# Patient Record
Sex: Female | Born: 1945 | Hispanic: Yes | Marital: Married | Smoking: Never smoker
Health system: Southern US, Community
[De-identification: ages and names within clinical notes are randomized; demographics above are authoritative.]

## PROBLEM LIST (undated history)

## (undated) DIAGNOSIS — K449 Diaphragmatic hernia without obstruction or gangrene: Secondary | ICD-10-CM

## (undated) DIAGNOSIS — K219 Gastro-esophageal reflux disease without esophagitis: Secondary | ICD-10-CM

## (undated) DIAGNOSIS — K648 Other hemorrhoids: Secondary | ICD-10-CM

## (undated) DIAGNOSIS — G309 Alzheimer's disease, unspecified: Secondary | ICD-10-CM

## (undated) DIAGNOSIS — F419 Anxiety disorder, unspecified: Secondary | ICD-10-CM

## (undated) DIAGNOSIS — F0280 Dementia in other diseases classified elsewhere without behavioral disturbance: Secondary | ICD-10-CM

## (undated) DIAGNOSIS — F329 Major depressive disorder, single episode, unspecified: Secondary | ICD-10-CM

## (undated) DIAGNOSIS — H9319 Tinnitus, unspecified ear: Secondary | ICD-10-CM

## (undated) DIAGNOSIS — F32A Depression, unspecified: Secondary | ICD-10-CM

## (undated) DIAGNOSIS — F0390 Unspecified dementia without behavioral disturbance: Secondary | ICD-10-CM

## (undated) DIAGNOSIS — K635 Polyp of colon: Secondary | ICD-10-CM

## (undated) HISTORY — PX: OTHER SURGICAL HISTORY: SHX169

## (undated) HISTORY — DX: Major depressive disorder, single episode, unspecified: F32.9

## (undated) HISTORY — DX: Diaphragmatic hernia without obstruction or gangrene: K44.9

## (undated) HISTORY — DX: Gastro-esophageal reflux disease without esophagitis: K21.9

## (undated) HISTORY — PX: CERVICAL SPINE SURGERY: SHX589

## (undated) HISTORY — DX: Polyp of colon: K63.5

## (undated) HISTORY — PX: WISDOM TOOTH EXTRACTION: SHX21

## (undated) HISTORY — DX: Tinnitus, unspecified ear: H93.19

## (undated) HISTORY — PX: ABDOMINAL HYSTERECTOMY: SHX81

## (undated) HISTORY — PX: APPENDECTOMY: SHX54

## (undated) HISTORY — DX: Dementia in other diseases classified elsewhere, unspecified severity, without behavioral disturbance, psychotic disturbance, mood disturbance, and anxiety: F02.80

## (undated) HISTORY — PX: TONSILLECTOMY: SUR1361

## (undated) HISTORY — DX: Other hemorrhoids: K64.8

## (undated) HISTORY — DX: Anxiety disorder, unspecified: F41.9

## (undated) HISTORY — DX: Alzheimer's disease, unspecified: G30.9

## (undated) HISTORY — DX: Unspecified dementia, unspecified severity, without behavioral disturbance, psychotic disturbance, mood disturbance, and anxiety: F03.90

## (undated) HISTORY — PX: MOUTH SURGERY: SHX715

## (undated) HISTORY — DX: Depression, unspecified: F32.A

---

## 1997-09-03 ENCOUNTER — Ambulatory Visit (HOSPITAL_COMMUNITY): Admission: RE | Admit: 1997-09-03 | Discharge: 1997-09-03 | Payer: Self-pay | Admitting: Family Medicine

## 1998-09-20 ENCOUNTER — Ambulatory Visit (HOSPITAL_COMMUNITY): Admission: RE | Admit: 1998-09-20 | Discharge: 1998-09-20 | Payer: Self-pay | Admitting: Family Medicine

## 1998-10-03 ENCOUNTER — Encounter: Payer: Self-pay | Admitting: Family Medicine

## 1998-10-03 ENCOUNTER — Ambulatory Visit (HOSPITAL_COMMUNITY): Admission: RE | Admit: 1998-10-03 | Discharge: 1998-10-03 | Payer: Self-pay | Admitting: Internal Medicine

## 1999-09-25 ENCOUNTER — Encounter: Payer: Self-pay | Admitting: Family Medicine

## 1999-09-25 ENCOUNTER — Ambulatory Visit (HOSPITAL_COMMUNITY): Admission: RE | Admit: 1999-09-25 | Discharge: 1999-09-25 | Payer: Self-pay | Admitting: Family Medicine

## 2000-09-28 ENCOUNTER — Ambulatory Visit (HOSPITAL_COMMUNITY): Admission: RE | Admit: 2000-09-28 | Discharge: 2000-09-28 | Payer: Self-pay | Admitting: Family Medicine

## 2000-09-28 ENCOUNTER — Encounter: Payer: Self-pay | Admitting: Family Medicine

## 2001-11-02 ENCOUNTER — Encounter: Payer: Self-pay | Admitting: Family Medicine

## 2001-11-02 ENCOUNTER — Ambulatory Visit (HOSPITAL_COMMUNITY): Admission: RE | Admit: 2001-11-02 | Discharge: 2001-11-02 | Payer: Self-pay | Admitting: Family Medicine

## 2002-11-06 ENCOUNTER — Encounter: Payer: Self-pay | Admitting: Family Medicine

## 2002-11-06 ENCOUNTER — Ambulatory Visit (HOSPITAL_COMMUNITY): Admission: RE | Admit: 2002-11-06 | Discharge: 2002-11-06 | Payer: Self-pay | Admitting: Family Medicine

## 2003-11-07 ENCOUNTER — Ambulatory Visit (HOSPITAL_COMMUNITY): Admission: RE | Admit: 2003-11-07 | Discharge: 2003-11-07 | Payer: Self-pay | Admitting: Family Medicine

## 2004-02-20 ENCOUNTER — Ambulatory Visit (HOSPITAL_COMMUNITY): Admission: RE | Admit: 2004-02-20 | Discharge: 2004-02-20 | Payer: Self-pay | Admitting: Gastroenterology

## 2004-02-20 ENCOUNTER — Encounter (INDEPENDENT_AMBULATORY_CARE_PROVIDER_SITE_OTHER): Payer: Self-pay | Admitting: *Deleted

## 2004-08-14 ENCOUNTER — Encounter: Admission: RE | Admit: 2004-08-14 | Discharge: 2004-08-14 | Payer: Self-pay | Admitting: Family Medicine

## 2004-11-28 ENCOUNTER — Encounter: Admission: RE | Admit: 2004-11-28 | Discharge: 2004-11-28 | Payer: Self-pay | Admitting: Family Medicine

## 2006-01-04 ENCOUNTER — Encounter: Admission: RE | Admit: 2006-01-04 | Discharge: 2006-01-04 | Payer: Self-pay | Admitting: Family Medicine

## 2006-01-26 ENCOUNTER — Encounter: Admission: RE | Admit: 2006-01-26 | Discharge: 2006-01-26 | Payer: Self-pay | Admitting: Family Medicine

## 2006-09-21 ENCOUNTER — Ambulatory Visit (HOSPITAL_COMMUNITY): Admission: RE | Admit: 2006-09-21 | Discharge: 2006-09-22 | Payer: Self-pay | Admitting: Neurosurgery

## 2007-02-18 ENCOUNTER — Encounter: Admission: RE | Admit: 2007-02-18 | Discharge: 2007-02-18 | Payer: Self-pay | Admitting: Family Medicine

## 2008-02-22 ENCOUNTER — Encounter: Admission: RE | Admit: 2008-02-22 | Discharge: 2008-02-22 | Payer: Self-pay | Admitting: Family Medicine

## 2009-06-19 ENCOUNTER — Encounter: Admission: RE | Admit: 2009-06-19 | Discharge: 2009-07-23 | Payer: Self-pay | Admitting: Family Medicine

## 2009-07-18 ENCOUNTER — Encounter: Admission: RE | Admit: 2009-07-18 | Discharge: 2009-07-18 | Payer: Self-pay | Admitting: Family Medicine

## 2009-07-19 ENCOUNTER — Encounter: Admission: RE | Admit: 2009-07-19 | Discharge: 2009-07-19 | Payer: Self-pay | Admitting: Family Medicine

## 2010-06-25 ENCOUNTER — Other Ambulatory Visit: Payer: Self-pay | Admitting: Dermatology

## 2010-07-11 ENCOUNTER — Other Ambulatory Visit: Payer: Self-pay | Admitting: Family Medicine

## 2010-07-11 DIAGNOSIS — Z1231 Encounter for screening mammogram for malignant neoplasm of breast: Secondary | ICD-10-CM

## 2010-07-31 ENCOUNTER — Ambulatory Visit: Payer: Self-pay

## 2010-07-31 ENCOUNTER — Ambulatory Visit
Admission: RE | Admit: 2010-07-31 | Discharge: 2010-07-31 | Disposition: A | Discharge status: Home / Self Care | Payer: BC Managed Care – PPO | Source: Ambulatory Visit | Attending: Family Medicine | Admitting: Family Medicine

## 2010-07-31 DIAGNOSIS — Z1231 Encounter for screening mammogram for malignant neoplasm of breast: Secondary | ICD-10-CM

## 2010-08-05 NOTE — Op Note (Signed)
[name], [name]                ACCOUNT NO.:  [id number]   MEDICAL RECORD NO.:  [id number]          PATIENT TYPE:  AMB   LOCATION:  SDS                          FACILITY:  [facility]   PHYSICIAN:  Danae Orleans. Venetia Maxon, M.D.  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   POSTOPERATIVE DIAGNOSIS:  Herniated cervical disk with spondylosis,  degenerative disease, stenosis and radiculopathy C5-6 and C6-7 levels.   PROCEDURE:  Anterior cervical decompression and fusion C5-6 and C6-7  levels with PEEK interbody cages, morcellized bone autograft and ostia  cell and anterior cervical plate.   SURGEON:  Dr. Venetia Maxon   ASSISTANT:  Poteat RN and Phoebe Perch, M.D.   ANESTHESIA:  General endotracheal anesthesia.   BLOOD LOSS:  Minimal.   COMPLICATIONS:  None.   DISPOSITION:  Recovery.   INDICATIONS:  [name] is a 65 year old woman with neck and left greater  than right upper extremity pain and weakness.  She has a significant  disk herniation at C6-7 on the left and also significant foraminal  stenosis C5-6 on the left.  It elected for her to undergo two level  anterior cervical decompression and fusion.   PROCEDURE:  [name] is brought to the operating room.  Following  satisfactory uncomplicated induction of general endotracheal anesthesia  and placed intravenous lines, the patient was placed in supine position  on the operating table.  Her neck was placed in slight extension.  She  was placed in 5 pounds halter traction.  Her anterior neck was then  prepped and draped in the usual sterile fashion. After infiltrating the  skin and subcutaneous tissues and in line with the left C6-5 carotid  tubercle on the left side of midline, from midline to the anterior  border  sternocleidomastoid muscle with local lidocaine.  Incision was  made carried sharply through platysma layer.  Subplatysmal dissection  was performed exposing the anterior border sternocleidomastoid muscle  and using blunt dissection the carotid sheath kept lateral, trachea and  esophagus kept medial exposing the anterior cervical spine.  A bent  spinal needle was placed at what was felt to be the C5-6 level and this  was confirmed on intraoperative x-ray. Using electrocautery and Key  elevator.  The longus colli muscles were taken down from the anterior  cervical spine from C5-C7 and self-retaining shadow line retractor was  placed facilitate exposure. Ventral osteophytes at C5-6 and C6-7 were  removed with Leksell rongeur and the bone was saved for later use in  bone grafting.  The interspaces at each of these levels were fairly  degenerated and disk material was removed in piecemeal fashion after  incising the interspace at C5-6, C6-7 level.  Subsequently the endplates  stripped of residual disk material with a variety of Carlen curettes.  High-speed drill was then utilized to decorticate the endplates at C6-7,  C5-6.  Distraction pins were used to facilitate the  disk space  distraction initially at C6-7. The endplates were decorticated and  uncinate spurs drilled down and subsequently, herniated disk material  was removed. The posterior longitudinal ligament was incised and removed  with 2 and 3 mm millimeter gold tip Kerrison rongeurs. Spinal cord dura  was decompressed as were both C7 nerve roots and the neural foramina.  Hemostasis was assured after trial sizing 6-mm PEEK interbody cage was  selected, packed with morcellized bone autograft and ostia cell,  inserted the interspace and countersunk appropriately.  Similar  decompression was performed at the C5-6 level where the large uncinate  spurs were drilled down and the endplates were decorticated. The  posterior longitudinal  ligament and osteophytes were removed resulting  in significant decompression of the thecal sac and both C6 nerve roots.  Hemostasis again assured and after a trial sizing a 55-mm medium PEEK  interbody cage was selected, packed morselized bone autograft ostia cell  inserted interspace and countersunk appropriately.  A 30 mm Trestle  anterior cervical plate was then affixed to the anterior cervical spine  using variable angle 14 mm screws two at C4, two at C6, and two at C7.  All screws had excellent purchase.  Locking mechanisms were engaged.  Traction weight was removed prior to placing the plate.  Final x-ray  demonstrated well-positioned interbody graft anterior cervical plate.  Wound was irrigated bacitracin saline.  Soft tissues were inspected and  found to be in good repair.  Platysma layer was closed with 3-0 Vicryl  sutures.  Skin edges were approximated 3-0 Vicryl interrupted inverted  sutures.  The wound was dressed with Dermabond.  The patient extubated  in the operating room taken to recovery in stable satisfactory having  tolerated operation well.  Counts correct at end of the case.      Danae Orleans. Venetia Maxon, M.D.  Electronically Signed     JDS/MEDQ  D:  09/21/2006  T:  09/21/2006  Job:  [id number]

## 2010-08-08 NOTE — Op Note (Signed)
[name], [name]                ACCOUNT NO.:  [id number]   MEDICAL RECORD NO.:  [id number]          PATIENT TYPE:  AMB   LOCATION:  ENDO                         [facility]ACILITY:  [facility]   PHYSICIAN:  John C. Madilyn Fireman, M.D.    DATE OF BIRTH:  Jul 08, 1945   DATE OF PROCEDURE:  02/20/2004  DATE OF DISCHARGE:                                 OPERATIVE REPORT   PROCEDURE:  Colonoscopy.   SURGEON:   INDICATIONS FOR PROCEDURE:  Average risk colon cancer screening in a 65-year-  old patient.   DESCRIPTION OF PROCEDURE:  The patient was placed in the left lateral  decubitus position and placed on the pulse monitor with continuous low flow  oxygen delivered by nasal cannula.  She was sedated with 50 mcg IV Fentanyl  and 6 mg IV Versed.  Olympus video colonoscope was inserted into the rectum  and advanced to the cecum, confirmed by transillumination of McBurney's  point, visualization of ileocecal valve and appendiceal orifice.  The prep  was excellent.  The cecum, ascending, transverse and descending colon all  appeared normal with no masses, polyps, diverticula or other mucosal  abnormalities.  Within the sigmoid colon there was a 6 mm polyp at 25 cm  which was fulgurated by hot biopsy.  The remainder of the sigmoid and rectum  appeared normal.  The scope was then withdrawn and the patient returned to  the recovery room in stable condition.  She tolerated the procedure well and  there were no immediate complications.   IMPRESSION:  Normal sigmoid colon polyp, otherwise normal study.   PLAN:  Await histology to determine method and interval for future colon  screening.      JCH/MEDQ  D:  02/20/2004  T:  02/20/2004  Job:  [id number]   cc:   Otilio Connors. Gerri Spore, M.D.  [address]  [place]  [place] [address]  Fax: [phone]

## 2011-01-07 LAB — COMPREHENSIVE METABOLIC PANEL
ALT: 23
Alkaline Phosphatase: 45
BUN: 13

## 2011-01-07 LAB — DIFFERENTIAL
Basophils Relative: 1
Eosinophils Relative: 1
Lymphocytes Relative: 50 — ABNORMAL HIGH
Monocytes Absolute: 0.4
Monocytes Relative: 8
Neutro Abs: 2
Neutrophils Relative %: 40 — ABNORMAL LOW

## 2011-01-07 LAB — PROTIME-INR
INR: 0.9
Prothrombin Time: 12.4

## 2011-01-07 LAB — CBC
HCT: 39
Hemoglobin: 13
MCHC: 33.4
MCV: 84
Platelets: 349
WBC: 5

## 2011-01-07 LAB — URINALYSIS, ROUTINE W REFLEX MICROSCOPIC
Ketones, ur: NEGATIVE
Protein, ur: NEGATIVE
Urobilinogen, UA: 0.2

## 2011-01-07 LAB — TYPE AND SCREEN: ABO/RH(D): O POS

## 2011-08-21 ENCOUNTER — Other Ambulatory Visit: Payer: Self-pay | Admitting: Family Medicine

## 2011-08-21 DIAGNOSIS — Z1231 Encounter for screening mammogram for malignant neoplasm of breast: Secondary | ICD-10-CM

## 2011-09-11 ENCOUNTER — Ambulatory Visit
Admission: RE | Admit: 2011-09-11 | Discharge: 2011-09-11 | Disposition: A | Discharge status: Home / Self Care | Payer: BC Managed Care – PPO | Source: Ambulatory Visit | Attending: Family Medicine | Admitting: Family Medicine

## 2011-09-11 DIAGNOSIS — Z1231 Encounter for screening mammogram for malignant neoplasm of breast: Secondary | ICD-10-CM

## 2011-10-05 ENCOUNTER — Ambulatory Visit: Payer: BC Managed Care – PPO

## 2012-10-31 ENCOUNTER — Other Ambulatory Visit: Payer: Self-pay

## 2012-10-31 DIAGNOSIS — Z1231 Encounter for screening mammogram for malignant neoplasm of breast: Secondary | ICD-10-CM

## 2012-11-16 ENCOUNTER — Ambulatory Visit
Admission: RE | Admit: 2012-11-16 | Discharge: 2012-11-16 | Disposition: A | Discharge status: Home / Self Care | Payer: Medicare Other | Source: Ambulatory Visit

## 2012-11-16 ENCOUNTER — Ambulatory Visit: Payer: BC Managed Care – PPO

## 2012-11-16 DIAGNOSIS — Z1231 Encounter for screening mammogram for malignant neoplasm of breast: Secondary | ICD-10-CM

## 2013-10-24 ENCOUNTER — Other Ambulatory Visit: Payer: Self-pay

## 2013-10-24 DIAGNOSIS — Z1231 Encounter for screening mammogram for malignant neoplasm of breast: Secondary | ICD-10-CM

## 2013-11-09 ENCOUNTER — Encounter: Payer: Self-pay | Admitting: Gastroenterology

## 2013-11-21 ENCOUNTER — Ambulatory Visit: Payer: Medicare Other

## 2013-11-28 ENCOUNTER — Ambulatory Visit: Payer: Medicare Other

## 2013-11-29 ENCOUNTER — Ambulatory Visit
Admission: RE | Admit: 2013-11-29 | Discharge: 2013-11-29 | Disposition: A | Discharge status: Home / Self Care | Payer: Medicare HMO | Source: Ambulatory Visit

## 2013-11-29 DIAGNOSIS — Z1231 Encounter for screening mammogram for malignant neoplasm of breast: Secondary | ICD-10-CM

## 2014-01-05 ENCOUNTER — Encounter: Payer: Self-pay | Admitting: Neurology

## 2014-01-05 ENCOUNTER — Ambulatory Visit (INDEPENDENT_AMBULATORY_CARE_PROVIDER_SITE_OTHER): Payer: Medicare HMO | Admitting: Neurology

## 2014-01-05 ENCOUNTER — Encounter (INDEPENDENT_AMBULATORY_CARE_PROVIDER_SITE_OTHER): Payer: Self-pay

## 2014-01-05 VITALS — BP 132/73 | HR 67 | Temp 97.7°F | Resp 14 | Ht 62.0 in | Wt 148.0 lb

## 2014-01-05 DIAGNOSIS — G3184 Mild cognitive impairment, so stated: Secondary | ICD-10-CM

## 2014-01-05 LAB — COMPREHENSIVE METABOLIC PANEL
ALT: 24 IU/L (ref 0–32)
AST: 22 IU/L (ref 0–40)
Albumin/Globulin Ratio: 1.8 (ref 1.1–2.5)
Albumin: 4.7 g/dL (ref 3.6–4.8)
Alkaline Phosphatase: 74 IU/L (ref 39–117)
BUN/Creatinine Ratio: 18 (ref 11–26)
BUN: 14 mg/dL (ref 8–27)
CALCIUM: 10.3 mg/dL (ref 8.7–10.3)
CO2: 28 mmol/L (ref 18–29)
CREATININE: 0.79 mg/dL (ref 0.57–1.00)
Chloride: 100 mmol/L (ref 96–108)
GFR calc Af Amer: 90 mL/min/{1.73_m2} (ref >59)
GFR calc non Af Amer: 78 mL/min/{1.73_m2} (ref >59)
GLUCOSE: 95 mg/dL (ref 65–99)
Globulin, Total: 2.6 g/dL (ref 1.5–4.5)
Potassium: 5.1 mmol/L (ref 3.5–5.2)
SODIUM: 137 mmol/L (ref 134–144)
Total Bilirubin: 0.2 mg/dL (ref 0.0–1.2)
Total Protein: 7.3 g/dL (ref 6.0–8.5)

## 2014-01-05 LAB — CBC WITH DIFFERENTIAL/PLATELET
BASOS: 1 %
Basophils Absolute: 0 10*3/uL (ref 0.0–0.2)
EOS: 1 %
Eosinophils Absolute: 0 10*3/uL (ref 0.0–0.4)
HEMATOCRIT: 40.3 % (ref 34.0–46.6)
Hemoglobin: 13.8 g/dL (ref 11.1–15.9)
LYMPHS: 49 %
Lymphocytes Absolute: 2.2 10*3/uL (ref 0.7–3.1)
MCH: 27.6 pg (ref 26.6–33.0)
MCHC: 34.2 g/dL (ref 31.5–35.7)
MCV: 81 fL (ref 79–97)
Monocytes Absolute: 0.4 10*3/uL (ref 0.1–0.9)
Monocytes: 8 %
Neutrophils Absolute: 1.8 10*3/uL (ref 1.4–7.0)
Neutrophils Relative %: 41 %
RBC: 5 x10E6/uL (ref 3.77–5.28)
RDW: 14.5 % (ref 12.3–15.4)
WBC: 4.4 10*3/uL (ref 3.4–10.8)

## 2014-01-05 NOTE — Progress Notes (Signed)
[facility]   Provider:  Larey Seat, M D  Referring Provider: Saralyn Pilar, MD Primary Care Physician:  Angelina Pih, MD   Chief Complaint  Patient presents with  . New Evaluation    rm 11    HPI:  [name] is a 68 y.o. female , who Is seen here as a referral from Dr. Prince Rome for subjective memory loss, hypersomnia.     [name] herself introduced her symptoms as anxiety depression and theophyllines in memory. She also had some weight loss associated and to she said that she is under a lot of stress.  She feels fatigued,  she has tinnitus,  changes in appetite disinterest in some activities sometimes, is confused but certainly feels forgetful, and she reports she has restless legs.  The patient reports that she is certainly forgetful at times little confused. She has a normal by the same object 3 times in the supermarkets she has to keep lists at certain places to not forget sequences shopping etc. Sometimes she forgets with a left has been placed. Her husband has taken over all the financial paperwork, and she reports that he also keeps no doctor's appointments etc. on her calendar. He rolled over the last year. Her antidepressants were changed by her primary care physician after her old one retired, she had increased the Zoloft from 46 to 100 mg , but felt her memory was not improved. Her husband tries to help her organize desks and kitchen cabinets , but she feels now more than ever that she cannot find what she looks for. This upsets her greatly.    She has regular bed times, at about 11 PM , reading in bed, falling asleep within 30 minutes or less. Her husband noted her legs kicking and twitching. She has no restless legs, however.  The patient is not aware of her movements, she acts out dreams now, yelling in her sleep , thrashing.  She denies visuial or auditory hallucinations.   Her surgical history she endorsed a left knee surgery hysterectomy an  anterior cervical spine fusion a tonsillectomy and appendectomy.  The patient lives with her husband  ( from [place] ) her children have gone on to their own homes,  she drinks about 3 caffeinated beverages a day . Her mother is alive at 57 ( [place] her father lived to a ripe age of 67.  She's currently on Ativan  ( started last week ) as needed to initiate sleep but also to help with anxiety attacks and she takes Zoloft 100 mg a day.  The patient is appearing happy and conversant, but concerned. She reported crying spells before antidepressants were introduced 2001, she reported she had a nervous break down on 12-02-99.    Review of Systems: Out of a complete 14 system review, the patient complains of only the following symptoms, and all other reviewed systems are negative.  I cannot focus- I go to the kitchen and forgot why. She endorsed the depression score at 5 points , by GDS 8 Mini-Mental Status Examination revealed 27/30 points. A MOCA will follow later.     History   Social History  . Marital Status: Married    Spouse Name: [name]    Number of Children: 2  . Years of Education: hs   Occupational History  . Retired    Social History Main Topics  . Smoking status: Never Smoker   . Smokeless tobacco: Not on file  . Alcohol  Use: No  . Drug Use: No  . Sexual Activity: Not on file   Other Topics Concern  . Not on file   Social History Narrative  . No narrative on file    History reviewed. No pertinent family history.  Past Medical History  Diagnosis Date  . Depression   . Anxiety     Past Surgical History  Procedure Laterality Date  . Abdominal hysterectomy    . Appendectomy    . Tonsillectomy    . Cervical spine surgery    . Knee surgery Right   . Mouth surgery      roof of mouth    Current Outpatient Prescriptions  Medication Sig Dispense Refill  . FLUoxetine (PROZAC) 40 MG capsule Take 40 mg by mouth daily.      Marland Kitchen gabapentin (NEURONTIN)  300 MG capsule Take 300 mg by mouth daily.      Marland Kitchen LORazepam (ATIVAN) 0.5 MG tablet Take 0.5 mg by mouth as needed.      . sertraline (ZOLOFT) 100 MG tablet Take 100 mg by mouth daily.       No current facility-administered medications for this visit.    Allergies as of 01/05/2014 - Review Complete 01/05/2014  Allergen Reaction Noted  . Ibuprofen Nausea And Vomiting 01/05/2014    Vitals: BP 132/73  Pulse 67  Temp(Src) 97.7 F (36.5 C) (Oral)  Resp 14  Ht 5\' 2"  (1.575 m)  Wt 148 lb (67.132 kg)  BMI 27.06 kg/m2 Last Weight:  Wt Readings from Last 1 Encounters:  01/05/14 148 lb (67.132 kg)       Last Height:   Ht Readings from Last 1 Encounters:  01/05/14 5\' 2"  (1.575 m)    Physical exam:  General: The patient is awake, alert and appears not in acute distress. The patient is well groomed. Head: Normocephalic, atraumatic. Neck is supple. Mallampati 2 She repo,  neck circumference: 13.5 inches . Nasal airflow unrestricted  TMJ is  Not evident . Retrognathia is not seen.  Cardiovascular:  Regular rate and rhythm , without  murmurs or carotid bruit, and without distended neck veins. Respiratory: Lungs are clear to auscultation. Skin:  Without evidence of edema, or rash Trunk: BMI is not  elevated and patient  has normal posture.  Neurologic exam : The patient is awake and alert, oriented to place and time.  She has a  fear of people talking about her.  Memory subjective  described as impaired, inattentive, unfocussed.  There is a normal attention span & concentration ability. Speech is fluent with aphasia. In her interview she was looking for words.   Mood and affect are giddy , Cranial nerves: Pupils are equal and briskly reactive to light. Funduscopic exam without evidence of pallor or edema.  Extraocular movements  in vertical and horizontal planes intact and without nystagmus. Visual fields by finger perimetry are intact. Hearing to finger rub intact.  Facial sensation  intact to fine touch. Facial motor strength is symmetric and tongue and uvula move midline.  Motor exam: Normal tone, muscle bulk and symmetric, strength in all extremities.  Sensory:  Fine touch, pinprick and vibration were tested in all extremities.  Proprioception is normal.  Coordination: Rapid alternating movements in the fingers/hands is normal.  Finger-to-nose maneuver normal without evidence of ataxia, dysmetria or tremor.  Gait and station: Patient walks without assistive device and is able unassisted to climb up to the exam table. Strength within normal limits. Stance is stable and normal.  Tandem gait is unfragmented.  Romberg testing is negative.  Deep tendon reflexes: in the  upper and lower extremities are symmetric and intact. Babinski maneuver response is  downgoing.   Assessment:  After physical and neurologic examination, review of laboratory studies, imaging, neurophysiology testing and pre-existing records, assessment is   1) Mild cognitive impairment, but this can be caused by medication - benzodiazepines and sleep aids can affect her memory next morning.  2) SSRI Zoloft , at 100 mg may be too high a dose for her. Anxiety and fatigue have improved after this medication was prescribed.  3) MOCA was added ,  Patient was not able to complete the trail making test, broke off at the last 2 steps. Clock drawing was impaired, numbers were misplaced. Remembered only one of 5 words, has trouble with serial 7th, 22-30 MOCA.     The patient was advised of the nature of the diagnosed sleep disorder , the treatment options and risks for general a health and wellness arising from not treating the condition. Visit duration was 45 minutes.   Plan:  Treatment plan and additional workup :  I would like to obtain an MRI brain ,  CMET, CBC and diff.  EEG Keep a sleep diary.  Rv in 4-6 weeks .        Asencion Partridge Addley Ballinger MD  01/05/2014

## 2014-01-08 ENCOUNTER — Ambulatory Visit (INDEPENDENT_AMBULATORY_CARE_PROVIDER_SITE_OTHER): Payer: Medicare HMO | Admitting: Radiology

## 2014-01-08 DIAGNOSIS — G3184 Mild cognitive impairment, so stated: Secondary | ICD-10-CM

## 2014-01-09 DIAGNOSIS — G3184 Mild cognitive impairment, so stated: Secondary | ICD-10-CM | POA: Insufficient documentation

## 2014-01-16 ENCOUNTER — Encounter: Payer: Self-pay | Admitting: Gastroenterology

## 2014-01-16 ENCOUNTER — Ambulatory Visit (INDEPENDENT_AMBULATORY_CARE_PROVIDER_SITE_OTHER): Payer: Medicare HMO | Admitting: Gastroenterology

## 2014-01-16 VITALS — BP 124/68 | HR 74 | Ht 62.0 in | Wt 148.0 lb

## 2014-01-16 DIAGNOSIS — R195 Other fecal abnormalities: Secondary | ICD-10-CM

## 2014-01-16 DIAGNOSIS — F32A Depression, unspecified: Secondary | ICD-10-CM

## 2014-01-16 DIAGNOSIS — R197 Diarrhea, unspecified: Secondary | ICD-10-CM | POA: Insufficient documentation

## 2014-01-16 DIAGNOSIS — F329 Major depressive disorder, single episode, unspecified: Secondary | ICD-10-CM | POA: Insufficient documentation

## 2014-01-16 NOTE — Assessment & Plan Note (Signed)
Recent Hemoccult-positive stool.  CBC is normal.  Occult bleeding do to neoplasm or polyp, in view of colonoscopy in 2014, is unlikely..  Recommendations #1 follow-up Hemoccults

## 2014-01-16 NOTE — Assessment & Plan Note (Signed)
Several week history of mild change of bowel habits consisting of poorly formed and loose stools.  I suspect that symptoms are related to medications, specifically antidepressants.  Recent colonoscopy in 2014 mitigates any structural left O'Malley's of the colon.  Recommendations #1 since patient's improvement from anxiety and depression may be due to Zoloft, will try to maintain this medication while adding an antidiarrheal medication.  She'll be started on Imodium 1-2 tabs daily

## 2014-01-16 NOTE — Progress Notes (Signed)
    _                                                                                                                History of Present Illness:  [name]  is a pleasant 68 year old female referred for evaluation of diarrhea.  For the past several weeks she is having problems with loose stools.  She has a loose stool every 1-2 days accompanied by some urgency.  She denies abdominal pain or rectal bleeding.  She did test Hemoccult positive August, 2015.  She apparently has been on various anti-depressants.  She most recently was placed on Zoloft and reports improvement in her anxiety and depression.  Last colonoscopy in 2014 was entirely normal.  Prior colonoscopy in 2005 demonstrated a hyperplastic polyp.   Past Medical History  Diagnosis Date  . Depression   . Anxiety   . Tinnitus    Past Surgical History  Procedure Laterality Date  . Abdominal hysterectomy    . Appendectomy    . Tonsillectomy    . Cervical spine surgery    . Knee surgery Right   . Mouth surgery      roof of mouth   family history is negative for Colon cancer. Current Outpatient Prescriptions  Medication Sig Dispense Refill  . LORazepam (ATIVAN) 0.5 MG tablet Take 0.5 mg by mouth as needed.      . sertraline (ZOLOFT) 100 MG tablet Take 100 mg by mouth daily.       No current facility-administered medications for this visit.   Allergies as of 01/16/2014 - Review Complete 01/16/2014  Allergen Reaction Noted  . Ibuprofen Nausea And Vomiting 01/05/2014    reports that she has never smoked. She has never used smokeless tobacco. She reports that she does not drink alcohol or use illicit drugs.   Review of Systems: Pertinent positive and negative review of systems were noted in the above HPI section. All other review of systems were otherwise negative.  Vital signs were reviewed in today's medical record Physical Exam: General: Well developed , well nourished, no acute distress Skin:  anicteric Head: Normocephalic and atraumatic Eyes:  sclerae anicteric, EOMI Ears: Normal auditory acuity Mouth: No deformity or lesions Neck: Supple, no masses or thyromegaly Lungs: Clear throughout to auscultation Heart: Regular rate and rhythm; no murmurs, rubs or bruits Abdomen: Soft, non tender and non distended. No masses, hepatosplenomegaly or hernias noted. Normal Bowel sounds Rectal:deferred Musculoskeletal: Symmetrical with no gross deformities  Skin: No lesions on visible extremities Pulses:  Normal pulses noted Extremities: No clubbing, cyanosis, edema or deformities noted Neurological: Alert oriented x 4, grossly nonfocal Cervical Nodes:  No significant cervical adenopathy Inguinal Nodes: No significant inguinal adenopathy Psychological:  Alert and cooperative. Normal mood and affect  See Assessment and Plan under Problem List

## 2014-01-16 NOTE — Patient Instructions (Signed)
Take 1-2 Imodium every morning Follow up in 6-8 weeks Call back to schedule we have no available appointments at this time

## 2014-01-17 ENCOUNTER — Telehealth: Payer: Self-pay | Admitting: Neurology

## 2014-01-17 NOTE — Telephone Encounter (Signed)
Patient's husband calling to check if Dr. Brett Fairy still wants patient to have MRI since her EEG came back fine, please return call and advise.

## 2014-01-17 NOTE — Procedures (Signed)
[facility]  EEG (ELECTROENCEPHALOGRAM) REPORT   STUDY DATE:  01-08-14  PATIENT NAME: [name], [name]   [birth year]  MRN:    ORDERING CLINICIAN: Larey Seat, MD   TECHNOLOGIST:  Ronnald Ramp  TECHNIQUE: Electroencephalogram was recorded utilizing standard 10-20 system of lead placement and reformatted into average and bipolar montages.      RECORDING TIME: 30 minutes. ACTIVATION:  strobe lights and hyperventilation    CLINICAL INFORMATION:   Subjective memory loss and hypersomnia, Dr. Prince Rome referred.     FINDINGS: The EEG Background rhythm was difficult to determine, most likely at 10 hertz . The study shows a lot of artefact and movement.  There appears to be beta fast rhythm in excess in the anterior EEG channels.  The  EKG heart rates varied from 66 to 69 hertz in NSR.   Photic stimulation caused some entrainment at 11 and 13 hertz , but excessive eye blink artefact interfered  . I was not able to detect epileptiform discharges, periodic changes. Patient recorded while awake and drowsy but not asleep . Photic stimulation/ Hyperventilation were performed without clinical sins of loss of awareness, staring spells or rhythmic movements.   IMPRESSION:  EEG is of limited validity.  There were no epileptiform activities noted but the background rhythm was difficult to detect.  In memory loss disorders, a slowing of the generalized brain activity is expected. This was not found.     Larey Seat , MD

## 2014-01-18 NOTE — Progress Notes (Signed)
Quick Note:  Results given to pt and husband this am by Dr. Brett Fairy. I relayed again this pm. Husband verbalized understanding. ______

## 2014-01-18 NOTE — Telephone Encounter (Signed)
Spoke to spouse and explained the MRI is still necessary. CD

## 2014-01-22 ENCOUNTER — Ambulatory Visit
Admission: RE | Admit: 2014-01-22 | Discharge: 2014-01-22 | Disposition: A | Discharge status: Home / Self Care | Payer: Medicare HMO | Source: Ambulatory Visit | Attending: Neurology | Admitting: Neurology

## 2014-01-22 DIAGNOSIS — G3184 Mild cognitive impairment, so stated: Secondary | ICD-10-CM

## 2014-01-22 MED ORDER — GADOBENATE DIMEGLUMINE 529 MG/ML IV SOLN
14.0000 mL | Freq: Once | INTRAVENOUS | Status: AC | PRN
  Administered 2014-01-22: 14 mL via INTRAVENOUS

## 2014-01-24 ENCOUNTER — Encounter: Payer: Self-pay | Admitting: Gastroenterology

## 2014-01-30 NOTE — Progress Notes (Signed)
Quick Note:  I gave the results of MRI brain to her husband who would relay to pt. They have appt 02-06-14 at 1330 (4 wk f/u). (normal MRI). He verbalized understanding. ______

## 2014-02-06 ENCOUNTER — Ambulatory Visit (INDEPENDENT_AMBULATORY_CARE_PROVIDER_SITE_OTHER): Payer: Medicare HMO | Admitting: Neurology

## 2014-02-06 ENCOUNTER — Encounter: Payer: Self-pay | Admitting: Neurology

## 2014-02-06 VITALS — BP 123/69 | HR 70 | Temp 98.8°F | Resp 14 | Ht 62.5 in | Wt 149.0 lb

## 2014-02-06 DIAGNOSIS — G3184 Mild cognitive impairment, so stated: Secondary | ICD-10-CM

## 2014-02-06 MED ORDER — METHYLPHENIDATE HCL 10 MG PO TABS: 10.0000 mg | ORAL_TABLET | Freq: Once | ORAL | Status: DC

## 2014-02-06 MED ORDER — SERTRALINE HCL 50 MG PO TABS: ORAL_TABLET | ORAL | Status: DC

## 2014-02-06 NOTE — Patient Instructions (Signed)
I would like for you to see Dr. Casimiro Needle for an evaluation of attention deficit versus amnestic cognitive deficits.

## 2014-02-06 NOTE — Progress Notes (Signed)
[facility]   Provider:  Larey [name], M D  Referring Provider: No ref. provider found Primary Care Physician:  Amy Pih, MD   Chief Complaint  Patient presents with  . RV memory    Rm 42, husband    HPI:  [name] is a 68 y.o. female , who Is seen here as a referral from [name], now [name]  for subjective memory loss, hypersomnia.       [name] herself introduced her symptoms as anxiety,  depression and decline in memory.  She also had some weight loss associated and  she said that she is under a lot of stress.  She feels fatigued, she has tinnitus, changes in appetite disinterest in some activities sometimes, is confused but certainly feels forgetful, and she reports she has restless legs.  The patient reports that she is certainly forgetful at times little confused. She has a normal by the same object 3 times in the supermarkets she has to keep lists at certain places to not forget sequences shopping etc. Sometimes she forgets with a left has been placed. Her husband has taken over all the financial paperwork, and she reports that he also keeps no doctor's appointments etc. on her calendar. He rolled over the last year. Her antidepressants were changed by her primary care physician after her old one retired, she had increased the Zoloft from 40 to 100 mg , but felt her memory was not improved. Her husband tries to help her organize desks and kitchen cabinets , but she feels now more than ever that she cannot find what she looks for. This upsets her greatly.    She has regular bed times, at about 11 PM , reading in bed, falling asleep within 30 minutes or less. Her husband noted her legs kicking and twitching. She has no restless legs, however.  The patient is not aware of her movements, she acts out dreams now, yelling in her sleep , thrashing.  She denies visuial or auditory hallucinations.  Her surgical history she endorsed a left knee  surgery hysterectomy an anterior cervical spine fusion a tonsillectomy and appendectomy.  The patient lives with her husband (from [place] ) her children have gone on to their own homes,  she drinks about 3 caffeinated beverages a day . Her mother is alive at 36 ( [place] her father lived to a ripe age of 67.  She's currently on Ativan  ( started last week ) as needed to initiate sleep but also to help with anxiety attacks and she takes Zoloft 100 mg a day.  The patient is appearing happy and conversant, but concerned. She reported crying spells before antidepressants were introduced 2001, she reported she had a nervous break down on 12-02-99.   Interval history : Patient brought her husband to today's appointment .  She had a normal EEG, and brain MRI .  We repeated her MOCA today, she scored  23 out of 30 points, lost 3 of the 5 recall words.  She still interchanges right and left, and her husband believes she has dyslexia. She displaces things around the house, including a Diamond ring in her house in the [place]  He described his wife as easily distracted and discouraged, and highly anxious.  I used the term mild cognitive impairment but would consider a trial of Aderall or Ritalin in this patient .  Would she be willing to see Dr. Casimiro [name]. I would like for her  to reduce the Zoloft to 50 mg daily. She is less sleepy on lesser Zoloft, sleeps 8 hours a day.       Review of Systems: Out of a complete 14 system review, the patient complains of only the following symptoms, and all other reviewed systems are negative.  I cannot focus- I go to the kitchen and forgot why.  She endorsed the depression score at 5 points , by GDS 8 Mini-Mental Status Examination revealed 27/30 points. A MOCA  23-30 points.      History   Social History  . Marital Status: Married    Spouse Name: N/A    Number of Children: 2  . Years of Education: hs   Occupational History  .  Retired    Social History Main Topics  . Smoking status: Never Smoker   . Smokeless tobacco: Never Used  . Alcohol Use: No     Comment: occ (glass of wine)  . Drug Use: No  . Sexual Activity: Not on file   Other Topics Concern  . Not on file   Social History Narrative   Right handed.   Caffeine 3 cups daily.  Married, 2 kids.  College.  Retired.      Family History  Problem Relation Age of Onset  . Colon cancer Neg Hx     Past Medical History  Diagnosis Date  . Depression   . Anxiety   . Tinnitus     Past Surgical History  Procedure Laterality Date  . Abdominal hysterectomy    . Appendectomy    . Tonsillectomy    . Cervical spine surgery    . Knee surgery Right   . Mouth surgery      roof of mouth    Current Outpatient Prescriptions  Medication Sig Dispense Refill  . sertraline (ZOLOFT) 100 MG tablet Take 100 mg by mouth daily.    Marland Kitchen LORazepam (ATIVAN) 0.5 MG tablet Take 0.5 mg by mouth as needed.     No current facility-administered medications for this visit.    Allergies as of 02/06/2014 - Review Complete 02/06/2014  Allergen Reaction Noted  . Ibuprofen Nausea And Vomiting 01/05/2014    Vitals: BP 123/69 mmHg  Pulse 70  Temp(Src) 98.8 F (37.1 C) (Oral)  Resp 14  Ht 5' 2.5" (1.588 m)  Wt 149 lb (67.586 kg)  BMI 26.80 kg/m2 Last Weight:  Wt Readings from Last 1 Encounters:  02/06/14 149 lb (67.586 kg)       Last Height:   Ht Readings from Last 1 Encounters:  02/06/14 5' 2.5" (1.588 m)    Physical exam:  General: The patient is awake, alert and appears not in acute distress. The patient is well groomed. Head: Normocephalic, atraumatic. Neck is supple. Mallampati 2 She repo,  neck circumference: 13.5 inches . Nasal airflow unrestricted  TMJ is  Not evident . Retrognathia is not seen.  Cardiovascular:  Regular rate and rhythm , without  murmurs or carotid bruit, and without distended neck veins. Respiratory: Lungs are clear to  auscultation. Skin:  Without evidence of edema, or rash Trunk: BMI is not  elevated and patient  has normal posture.  Neurologic exam : The patient is awake and alert, oriented to place and time.  She has a  fear of people talking about her.  Memory subjective  described as impaired, inattentive, unfocussed.  There is a normal attention span & concentration ability. Speech is fluent with aphasia. In her interview she was  looking for words.   Mood and affect are giddy , Cranial nerves: Pupils are equal and briskly reactive to light. Funduscopic exam without evidence of pallor or edema.  Extraocular movements  in vertical and horizontal planes intact and without nystagmus. Visual fields by finger perimetry are intact. Hearing to finger rub intact.  Facial sensation intact to fine touch. Facial motor strength is symmetric and tongue and uvula move midline.  Motor exam: Normal tone, muscle bulk and symmetric, strength in all extremities.  Sensory:  Fine touch, pinprick and vibration were tested in all extremities.  Proprioception is normal.  Coordination: Rapid alternating movements in the fingers/hands is normal.  Finger-to-nose maneuver normal without evidence of ataxia, dysmetria or tremor.  Gait and station: Patient walks without assistive device and is able unassisted to climb up to the exam table. Strength within normal limits. Stance is stable and normal. Tandem gait is unfragmented.  Romberg testing is negative.  Deep tendon reflexes: in the  upper and lower extremities are symmetric and intact. Babinski maneuver response is  downgoing.   Assessment:  After physical and neurologic examination, review of laboratory studies, imaging, neurophysiology testing and pre-existing records, assessment is   1) Mild cognitive impairment, but this can be caused by medication - benzodiazepines and sleep aids can affect her memory next morning. She will d/c her sleep aid.  2) SSRI Zoloft , at 100  mg, this  may be too high a dose for her. Anxiety and fatigue have improved after this medication was prescribed.  I like for her to go to 50 mg daily.  3) MOCA was added , last visit :  Patient was not able to complete the trail making test, broke off at the last 2 steps. Clock drawing was impaired, numbers were misplaced. Remembered only one of 5 words, has trouble with serial 7th, 22-30 MOCA.    On11-17-15 , MOCA was 23-30 points.  MRI brain reviewed, EEG and CMET, CBC and Diff were all normal.     The patient was advised of the nature of the diagnosed disorder , the treatment options and risks for general a health and wellness arising from not treating the condition. Visit duration was 30 minutes.   Plan:  Treatment plan and additional workup : Continue melatonin. Reduce Zoloft,  start Ritalin AM .      Amy Seat MD  02/06/2014

## 2014-02-12 ENCOUNTER — Telehealth: Payer: Self-pay | Admitting: Neurology

## 2014-02-12 NOTE — Telephone Encounter (Signed)
[name] from [facility] needs Dx code for methylphenidate (RITALIN) 10 MG tablet.  She will also fax a request over.

## 2014-02-12 NOTE — Telephone Encounter (Signed)
Fax received, completed and returned to [place].

## 2014-03-01 ENCOUNTER — Telehealth: Payer: Self-pay | Admitting: Neurology

## 2014-03-01 NOTE — Telephone Encounter (Signed)
Patient's spouse stated Psychiatrist wants to increase Rx sertraline (ZOLOFT) 50 MG tablet to 100 mg, but stated 100 mg gave patient diarrhea.  Also questioning if patient should discontinue methylphenidate (RITALIN) 10 MG tablet.  Please call and advise.

## 2014-03-01 NOTE — Telephone Encounter (Signed)
I spoke to pt and and he relayed that psychiatrist is asking to try the zoloft 100mg  again and see if diarrhea coincidence, ok to do?  Has noted no change in behavior from ritalin 10mg  daily.  Continue?  He is afraid of drug interactions with the three drugs (ativan too).  Also skeptical that psychotherapy would help.  Reccs on this please.

## 2014-03-01 NOTE — Telephone Encounter (Signed)
I will be back in the office on Monday, 05 Mar 2014.

## 2014-03-07 NOTE — Telephone Encounter (Signed)
Patient's husband is calling back to speak with Dr. Brett Fairy to discuss the medication Zoloft and Ritalin.

## 2014-03-08 MED ORDER — METHYLPHENIDATE HCL 10 MG PO TABS: 10.0000 mg | ORAL_TABLET | Freq: Once | ORAL | Status: DC

## 2014-03-08 NOTE — Telephone Encounter (Signed)
I called husband.   He stated that pt is doing ok with the zoloft 50mg  po daily and the ritalin 10mg  po daily.  He has not given the xanax.  He does not want to try the 100mg  zoloft as recommended by psychiatry at this time.  Would like refill on ritalin.   Needs prior to going to [place] in 03-14-14.  [place] contact # if needed is [phone].  I forwarded to [place].   Did relay Dr. Brett Fairy out until 03-19-14.

## 2014-03-08 NOTE — Telephone Encounter (Signed)
Sandy, please let patient know Ritalin 10mg  qday 30 tabs is ready for her.

## 2014-03-12 NOTE — Telephone Encounter (Signed)
Patients husband came to the front desk to pick up patients Rx for Ritalin 10mg .

## 2014-03-26 ENCOUNTER — Telehealth: Payer: Self-pay | Admitting: Neurology

## 2014-03-26 MED ORDER — METHYLPHENIDATE HCL 10 MG PO TABS: 10.0000 mg | ORAL_TABLET | Freq: Once | ORAL | Status: DC

## 2014-03-26 NOTE — Telephone Encounter (Signed)
We can not give more than 90 days of a stimulant , no refills. I am OK to mail him th prescription to [facility] or home here in [place]. CD

## 2014-03-26 NOTE — Telephone Encounter (Signed)
Patient's husband is calling to get a written Rx for patient for Ritalin. Can Rx be mailed because patient is out of town until April or May. The address is [address] [place], [place] [address]. Please call and advise. Thank you.

## 2014-03-26 NOTE — Telephone Encounter (Signed)
Spouse requesting refill on Ritalin.  Rx was last written on 12/17.  I called back.  Explained the CII Rx's need to be picked up at the office.  He says are currently out of town, and they will be in [place] until April or May.  He was very adamant that the Rx be mailed to them in [place].  He requested that a message be sent to the provider asking for approval for Rx to be written early (to allow time for mailing) and mailed.  Please advise.  Thank you.

## 2014-04-09 ENCOUNTER — Telehealth: Payer: Self-pay | Admitting: Neurology

## 2014-04-09 ENCOUNTER — Other Ambulatory Visit: Payer: Self-pay | Admitting: Neurology

## 2014-04-09 NOTE — Telephone Encounter (Signed)
Patient is calling back as they have still not received the Rx for Ritalin at the [place] address:  [address] [place], [place]  [address].  Please advise.

## 2014-04-09 NOTE — Telephone Encounter (Signed)
    [facility] Size     Small Medium Large Extra Extra Large    Valyn Latchford  03/26/2014  Telephone  MRN:  [id number]   Description: 69 year old female  Provider: Larey Seat, MD  Department: [facility]       Reason for Call     Advice Only    Medication Management    Medication Refill         Call Documentation      Larey Seat, MD at 03/26/2014 4:56 PM     Status: Signed       Expand All Collapse All   We can not give more than 90 days of a stimulant , no refills. I am OK to mail him th prescription to [facility] or home here in [place]. CD             Norva Pavlov, CPHT at 03/26/2014 3:04 PM     Status: Signed       Expand All Collapse All   Spouse requesting refill on Ritalin. Rx was last written on 12/17. I called back. Explained the CII Rx's need to be picked up at the office. He says are currently out of town, and they will be in [place] until April or May. He was very adamant that the Rx be mailed to them in [place]. He requested that a message be sent to the provider asking for approval for Rx to be written early (to allow time for mailing) and mailed. Please advise. Thank you.             Benay Pillow at 03/26/2014 1:58 PM     Status: Signed       Expand All Collapse All   Patient's husband is calling to get a written Rx for patient for Ritalin. Can Rx be mailed because patient is out of town until April or May. The address is [address] [place], [place] [address]. Please call and advise. Thank you.             Encounter MyChart Messages     No messages in this encounter     Routing History     Priority Sent On From To Message Type     03/26/2014 5:10 PM Norva Pavlov, [place], [place] Patient Calls     03/26/2014 4:57 PM Larey Seat, MD Norva Pavlov, CPHT Patient Calls     03/26/2014 3:30 PM Norva Pavlov, CPHT Larey Seat, MD Patient Calls      03/26/2014 2:02 PM Ileana Ladd, CPHT Patient Calls      Created by     Benay Pillow on 03/26/2014 01:54 PM     Approved      Disp Refills Start End    methylphenidate (RITALIN) 10 MG tablet 90 tablet 0 03/26/2014     Sig - Route:  Take 1 tablet (10 mg total) by mouth once. - Oral    Class:  Print    DAW:  No    Authorizing Provider:  Larey Seat, MD      Visit Pharmacy     [place], [place]     Contacts       Type Contact Phone    03/26/2014 01:54 PM Phone (Incoming) [place] (Emergency Contact) [phone]    03/26/2014 03:04 PM Phone Lahoma Crocker) Arsenio Loader (Emergency Contact) [phone] Jerilynn Mages)

## 2014-04-10 MED ORDER — METHYLPHENIDATE HCL 10 MG PO TABS: 10.0000 mg | ORAL_TABLET | Freq: Once | ORAL | Status: DC

## 2014-04-10 NOTE — Telephone Encounter (Signed)
I have placed a 30 day supply prescription in an envelope and  This was supposed to be mailed to patient's  home address.  There are no refills for this medication. She needs to pick it up, or transfer the prescription to [facility] for the duration of her stay?  I will ask Dr Casimiro Needle if his office would allow a different policy, such as 90 days of a stimulant . CD

## 2014-07-16 ENCOUNTER — Other Ambulatory Visit: Payer: Self-pay | Admitting: Neurology

## 2014-07-16 MED ORDER — METHYLPHENIDATE HCL 10 MG PO TABS: 10.0000 mg | ORAL_TABLET | Freq: Once | ORAL | Status: DC

## 2014-07-16 NOTE — Telephone Encounter (Signed)
Patient's spouse requesting 90 day supply for Rx methylphenidate (RITALIN) 10 MG tablet.  Please forward Rx to [facility] on [address]. [place].  Please call and advise.

## 2014-07-16 NOTE — Telephone Encounter (Signed)
Dr Brett Fairy is out of the office.  Request sent to [facility] for approval.  Unfortunately, this is a CII, therefore it will have to be picked up, we cannot fax it to the pharmacy.  I called the patient back.  They are aware Rx will need to be picked up.

## 2014-09-28 ENCOUNTER — Other Ambulatory Visit: Payer: Self-pay

## 2014-09-28 DIAGNOSIS — Z1231 Encounter for screening mammogram for malignant neoplasm of breast: Secondary | ICD-10-CM

## 2014-10-11 ENCOUNTER — Ambulatory Visit (INDEPENDENT_AMBULATORY_CARE_PROVIDER_SITE_OTHER): Payer: Medicare HMO | Admitting: Neurology

## 2014-10-11 ENCOUNTER — Encounter: Payer: Self-pay | Admitting: Neurology

## 2014-10-11 VITALS — BP 140/70 | HR 78 | Resp 20 | Ht 62.0 in | Wt 155.0 lb

## 2014-10-11 DIAGNOSIS — F411 Generalized anxiety disorder: Secondary | ICD-10-CM

## 2014-10-11 DIAGNOSIS — F901 Attention-deficit hyperactivity disorder, predominantly hyperactive type: Secondary | ICD-10-CM | POA: Diagnosis not present

## 2014-10-11 DIAGNOSIS — G3184 Mild cognitive impairment, so stated: Secondary | ICD-10-CM

## 2014-10-11 MED ORDER — SERTRALINE HCL 50 MG PO TABS: ORAL_TABLET | ORAL | Status: DC

## 2014-10-11 MED ORDER — METHYLPHENIDATE HCL 10 MG PO TABS: 10.0000 mg | ORAL_TABLET | Freq: Once | ORAL | Status: DC

## 2014-10-11 NOTE — Progress Notes (Signed)
[facility]   Provider:  Larey Seat, M D  Referring Provider: Saralyn Pilar, MD Primary Care Physician:  Angelina Pih, MD   Chief Complaint  Patient presents with  . Follow-up    memory loss, rm 11, alone    HPI:  [name] is a 69 y.o. female , who Is seen here as a referral from Dr. Jonny Ruiz, now Dr. Prince Rome , now looking for another PCP-  for subjective short termmemory loss, word finding  and hypersomnia.     [name] herself introduced her symptoms as anxiety,  depression and decline in memory. [name] also had some weight loss associated and  [name] said that [name] is under a lot of stress.  [name] feels fatigued, [name] has tinnitus, changes in appetite disinterest in some activities sometimes, is confused but certainly feels forgetful, and [name] reports [name] has restless legs. The patient reports that [name] is certainly forgetful at times little confused. [name] has a normal by the same object 3 times in the supermarkets [name] has to keep lists at certain places to not forget sequences shopping etc. Sometimes [name] forgets with a left has been placed. Her husband has taken over all the financial paperwork, and [name] reports that he also keeps no doctor's appointments etc. on her calendar. He rolled over the last year. Her antidepressants were changed by her primary care physician after her old one retired, [name] had increased the Zoloft from 63 to 100 mg , but felt her memory was not improved. Her husband tries to help her organize desks and kitchen cabinets , but [name] feels now more than ever that [name] cannot find what [name] looks for. This upsets her greatly.   [name] has regular bed times, at about 11 PM , reading in bed, falling asleep within 30 minutes or less. Her husband noted her legs kicking and twitching. [name] has no restless legs, however.  The patient is not aware of her movements, [name] acts out dreams now, yelling in her sleep , thrashing.  [name] denies visuial or auditory  hallucinations.  Her surgical history [name] endorsed a left knee surgery hysterectomy an anterior cervical spine fusion a tonsillectomy and appendectomy. The patient lives with her husband (from [place] ) her children have gone on to their own homes,  [name] drinks about 3 caffeinated beverages a day . Her mother is alive at 30 ( [place] her father lived to a ripe age of 13.   [name]'s currently on Ativan  ( started last week ) as needed to initiate sleep but also to help with anxiety attacks and [name] takes Zoloft 100 mg a day.  The patient is appearing happy and conversant, but concerned. [name] reported crying spells before antidepressants were introduced 2001, [name] reported [name] had a nervous break down on 12-02-99.   Interval history : Patient brought her husband to today's appointment .  [name] had a normal EEG, and brain MRI .  We repeated her MOCA today, [name] scored  23 out of 30 points, lost 3 of the 5 recall words.  [name] still interchanges right and left, and her husband believes [name] has dyslexia. [name] displaces things around the house, including a Diamond ring in her house in the [place]  He described his wife as easily distracted and discouraged, and highly anxious.  I used the term mild cognitive impairment but would consider a trial of Aderall or Ritalin in this patient .  Would [name] be willing to see  Dr. Casimiro Needle. I would like for her to reduce the Zoloft to 50 mg daily. [name] is less sleepy on lesser Zoloft, sleeps 8 hours a day.    Interval history 10-11-14, [name] is here today for a repeat patient visits after 8 months. [name] has no bird last winter in [place] and has started taking a stimulant medication which [name] feels has helped her to concentrate and remained alert during the day. A reduction in Lexapro was also beneficial for her overall mood and alertness. Today be performed again a Montral cognitive assessment test and [name] scored 26 out of 30 points [name] was fully  oriented to time and place [name] recalled 3 out of 5 words on recall had no trouble with attention subtraction, association of language and obstruction. [name] named ALT 3 animals correctly and that the work fluency test score at 19 points. [name] did perfect on the clock drawing [name] was able to copy a treated three-dimensional image of acute. [name] failed the trail making test.  The patient presented with excessive daytime sleepiness reflected and an Epworth sleepiness score of 13 points.  There was a report of a dry mouth in the morning witnessed lo ud snoring and witnessed apneas by his spouse. [name] reports god apetite.  The patient's regular sleep routine is as follows; the patient normally goes to bed around 11 and falls asleep within 10-15 minutes he is awoken 3 times at night and nocturia and is woken by his alarm clock at 6 in the morning. 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    Review of Systems: 10-11-14 Out of a complete 14 system review, the patient complains of only the following symptoms, and all other reviewed systems are negative.  I cannot focus- I go to the kitchen and forgot why. Still evidence of word finding in a conversation, trouble with appointments. No more crying spells, not as anxious.   [name] endorsed the geriatric depression score at 2 points , by GDS 8 Mini-Mental Status Examination revealed 29 out of 30 , last visit 27/30 points.  A MOCA 26 , last visit  23-30 points.       History   Social History  . Marital Status: Married    Spouse Name: N/A  . Number of Children: 2  . Years of Education: hs   Occupational History  . Retired    Social History Main Topics  . Smoking status: Never Smoker   . Smokeless tobacco: Never Used  . Alcohol Use: No     Comment: occ (glass of wine)  . Drug Use: No  . Sexual Activity: Not on file   Other Topics Concern  . Not on file   Social History Narrative   Right handed.   Caffeine 3 cups daily.  Married, 2  kids.  College.  Retired.      Family History  Problem Relation Age of Onset  . Colon cancer Neg Hx     Past Medical History  Diagnosis Date  . Depression   . Anxiety   . Tinnitus     Past Surgical History  Procedure Laterality Date  . Abdominal hysterectomy    . Appendectomy    . Tonsillectomy    . Cervical spine surgery    . Knee surgery Right   . Mouth surgery      roof of mouth    Current Outpatient Prescriptions  Medication Sig Dispense Refill  . methylphenidate (RITALIN) 10  MG tablet Take 1 tablet (10 mg total) by mouth once. daily 90 tablet 0  . sertraline (ZOLOFT) 50 MG tablet Use a half tab of 100 mg in AM po. 45 tablet 0   No current facility-administered medications for this visit.    Allergies as of 10/11/2014 - Review Complete 10/11/2014  Allergen Reaction Noted  . Ibuprofen Nausea And Vomiting 01/05/2014    Vitals: BP 140/70 mmHg  Pulse 78  Resp 20  Ht 5\' 2"  (1.575 m)  Wt 155 lb (70.308 kg)  BMI 28.34 kg/m2 Last Weight:  Wt Readings from Last 1 Encounters:  10/11/14 155 lb (70.308 kg)       Last Height:   Ht Readings from Last 1 Encounters:  10/11/14 5\' 2"  (1.575 m)    Physical exam:  General: The patient is awake, alert and appears not in acute distress. The patient is well groomed. Head: Normocephalic, atraumatic. Neck is supple. Mallampati 2 [name] repo,  neck circumference: 13.5 inches . Nasal airflow unrestricted  TMJ is  Not evident . Retrognathia is not seen.  Cardiovascular:  Regular rate and rhythm , without  murmurs or carotid bruit, and without distended neck veins. Respiratory: Lungs are clear to auscultation. Skin:  Without evidence of edema, or rash Trunk: BMI is not  elevated and patient  has normal posture.  Neurologic exam : The patient is awake and alert, oriented to place and time.  [name] has a  fear of people talking about her.  Memory subjective  described as impaired, inattentive, unfocussed.  There is a normal  attention span & concentration ability. Speech is fluent with aphasia. In her interview [name] was looking for words.   Mood and affect are giddy , Cranial nerves: Pupils are equal and briskly reactive to light. Funduscopic exam without evidence of pallor or edema.  Extraocular movements  in vertical and horizontal planes intact and without nystagmus. Visual fields by finger perimetry are intact. Hearing to finger rub intact.  Facial sensation intact to fine touch. Facial motor strength is symmetric and tongue and uvula move midline.  Motor exam: Normal tone, muscle bulk and symmetric, strength in all extremities.  Sensory:  Fine touch, pinprick and vibration were tested in all extremities.  Proprioception is normal.  Coordination: Rapid alternating movements in the fingers/hands is normal.  Finger-to-nose maneuver normal without evidence of ataxia, dysmetria or tremor.  Gait and station: Patient walks without assistive device . Strength within normal limits. Stance is stable and normal. Tandem gait is unfragmented.  Romberg testing is negative.  Deep tendon reflexes: in the  upper and lower extremities are symmetric and intact. Babinski maneuver response is  downgoing.   Assessment:  After physical and neurologic examination, review of laboratory studies, imaging, neurophysiology testing and pre-existing records, assessment is IMPROVED COGNITIVE FUNCTION.  1) Mild cognitive impairment, but this can be caused by medication - benzodiazepines and sleep aids can affect her memory next morning. [name] will d/c her sleep aid.  2) SSRI Zoloft , Anxiety and fatigue have improved after this medication was prescribed.  I like for her to go to 25 mg daily.  3) MOCA was added , last visit :  Patient was not able to complete the trail making test, broke off at the last 2 steps. Clock drawing was unimpaired, Remembered 3 of 5 words, has no trouble with serial 7th, Much improved over last year.   On11-17-15  , MOCA was 23-30 points.  26-30 points on 10-11-14  .  The patient was advised of the nature of the diagnosed disorder , the treatment options and risks for general a heNDalth and wellness arising from not treating the condition. [name] is short term memory impaired/  [name] is anxious, but less tan a year ago.  Visit duration was 30 minutes.   Plan:  Treatment plan and additional workup : Continue melatonin.  Reduce Zoloft to 2 5 mg daily upon patient's request. I advised her to return to the 50 mg dose should [name] become tearful or highly anxious. Continue Ritalin .     Asencion Partridge Annice Jolly MD  10/11/2014

## 2014-10-11 NOTE — Patient Instructions (Signed)
Methylphenidate tablets What is this medicine? METHYLPHENIDATE (meth il FEN i date) is used to treat attention-deficit hyperactivity disorder (ADHD). It is also used to treat narcolepsy. This medicine may be used for other purposes; ask your health care provider or pharmacist if you have questions. COMMON BRAND NAME(S): Methylin, Ritalin What should I tell my health care provider before I take this medicine? They need to know if you have any of these conditions: -anxiety or panic attacks -circulation problems in fingers and toes -glaucoma -hardening or blockages of the arteries or heart blood vessels -heart disease or a heart defect -high blood pressure -history of a drug or alcohol abuse problem -history of stroke -liver disease -mental illness -motor tics, family history or diagnosis of Tourette's syndrome -seizures -suicidal thoughts, plans, or attempt; a previous suicide attempt by you or a family member -thyroid disease -an unusual or allergic reaction to methylphenidate, other medicines, foods, dyes, or preservatives -pregnant or trying to get pregnant -breast-feeding How should I use this medicine? Take this medicine by mouth with a glass of water. Follow the directions on the prescription label. It is best to take this medicine 30 to 45 minutes before meals, unless your doctor tells you otherwise. Take your medicine at regular intervals. Usually the last dose of the day will be taken at least 4 to 6 hours before bedtime, so it will not interfere with sleep. Do not take your medicine more often than directed. A special MedGuide will be given to you by the pharmacist with each prescription and refill. Be sure to read this information carefully each time. Talk to your pediatrician regarding the use of this medicine in children. While this drug may be prescribed for children as young as 6 years of age for selected conditions, precautions do apply. Overdosage: If you think you have  taken too much of this medicine contact a poison control center or emergency room at once. NOTE: This medicine is only for you. Do not share this medicine with others. What if I miss a dose? If you miss a dose, take it as soon as you can. If it is almost time for your next dose, take only that dose. Do not take double or extra doses. What may interact with this medicine? Do not take this medicine with any of the following medications: -lithium -MAOIs like Carbex, Eldepryl, Marplan, Nardil, and Parnate -other stimulant medicines for attention disorders, weight loss, or to stay awake -procarbazine This medicine may also interact with the following medications: -atomoxetine -caffeine -certain medicines for blood pressure, heart disease, irregular heart beat -certain medicines for depression, anxiety, or psychotic disturbances -certain medicines for seizures like carbamazepine, phenobarbital, phenytoin -cold or allergy medicines -warfarin This list may not describe all possible interactions. Give your health care provider a list of all the medicines, herbs, non-prescription drugs, or dietary supplements you use. Also tell them if you smoke, drink alcohol, or use illegal drugs. Some items may interact with your medicine. What should I watch for while using this medicine? Visit your doctor or health care professional for regular checks on your progress. This prescription requires that you follow special procedures with your doctor and pharmacy. You will need to have a new written prescription from your doctor or health care professional every time you need a refill. This medicine may affect your concentration, or hide signs of tiredness. Until you know how this drug affects you, do not drive, ride a bicycle, use machinery, or do anything that needs mental alertness.   Tell your doctor or health care professional if this medicine loses its effects, or if you feel you need to take more than the  prescribed amount. Do not change the dosage without talking to your doctor or health care professional. For males, contact your doctor or health care professional right away if you have an erection that lasts longer than 4 hours or if it becomes painful. This may be a sign of a serious problem and must be treated right away to prevent permanent damage. Decreased appetite is a common side effect when starting this medicine. Eating small, frequent meals or snacks can help. Talk to your doctor if you continue to have poor eating habits. Height and weight growth of a child taking this medicine will be monitored closely. Do not take this medicine close to bedtime. It may prevent you from sleeping. If you are going to need surgery, a MRI, CT scan, or other procedure, tell your doctor that you are taking this medicine. You may need to stop taking this medicine before the procedure. Tell your doctor or healthcare professional right away if you notice unexplained wounds on your fingers and toes while taking this medicine. You should also tell your healthcare provider if you experience numbness or pain, changes in the skin color, or sensitivity to temperature in your fingers or toes. What side effects may I notice from receiving this medicine? Side effects that you should report to your doctor or health care professional as soon as possible: -allergic reactions like skin rash, itching or hives, swelling of the face, lips, or tongue -changes in vision -chest pain or chest tightness -fast, irregular heartbeat -fingers or toes feel numb, cool, painful -hallucination, loss of contact with reality -high blood pressure -males: prolonged or painful erection -seizures -severe headaches -shortness of breath -suicidal thoughts or other mood changes -trouble walking, dizziness, loss of balance or coordination -uncontrollable head, mouth, neck, arm, or leg movements -unusual bleeding or bruising Side effects that  usually do not require medical attention (report to your doctor or health care professional if they continue or are bothersome): -anxious -headache -loss of appetite -nausea, vomiting -trouble sleeping -weight loss This list may not describe all possible side effects. Call your doctor for medical advice about side effects. You may report side effects to FDA at 1-800-FDA-1088. Where should I keep my medicine? Keep out of the reach of children. This medicine can be abused. Keep your medicine in a safe place to protect it from theft. Do not share this medicine with anyone. Selling or giving away this medicine is dangerous and against the law. Store at room temperature between 15 and 30 degrees C (59 and 86 degrees F). Protect from light and moisture. Keep container tightly closed. Throw away any unused medicine after the expiration date. NOTE: This sheet is a summary. It may not cover all possible information. If you have questions about this medicine, talk to your doctor, pharmacist, or health care provider.  2015, [employer]vier/Gold Standard. (2012-11-28 10:09:08)

## 2014-10-16 NOTE — Telephone Encounter (Signed)
ERROR

## 2014-12-03 ENCOUNTER — Ambulatory Visit
Admission: RE | Admit: 2014-12-03 | Discharge: 2014-12-03 | Disposition: A | Discharge status: Home / Self Care | Payer: Medicare HMO | Source: Ambulatory Visit

## 2014-12-03 DIAGNOSIS — Z1231 Encounter for screening mammogram for malignant neoplasm of breast: Secondary | ICD-10-CM

## 2015-01-17 NOTE — Telephone Encounter (Signed)
Error

## 2015-01-30 ENCOUNTER — Telehealth: Payer: Self-pay

## 2015-01-30 ENCOUNTER — Other Ambulatory Visit: Payer: Self-pay | Admitting: Neurology

## 2015-01-30 MED ORDER — METHYLPHENIDATE HCL 10 MG PO TABS: 10.0000 mg | ORAL_TABLET | Freq: Once | ORAL | Status: DC

## 2015-01-30 NOTE — Telephone Encounter (Signed)
Husband called to request refill of methylphenidate (RITALIN) 10 MG tablet, will be going out of town in 3-4 days would like to get this Rx before then.

## 2015-01-30 NOTE — Telephone Encounter (Signed)
Request entered, forwarded to provider for review.  

## 2015-01-30 NOTE — Telephone Encounter (Signed)
Spoke to pt and advised her that her RX for ritalin was ready for pick up at the front desk. Gave clinic hours. Pt verbalized understanding.

## 2015-02-09 ENCOUNTER — Other Ambulatory Visit: Payer: Self-pay | Admitting: Neurology

## 2015-04-15 ENCOUNTER — Ambulatory Visit: Payer: Medicare HMO | Admitting: Neurology

## 2015-04-29 ENCOUNTER — Other Ambulatory Visit: Payer: Self-pay | Admitting: Neurology

## 2015-04-29 MED ORDER — METHYLPHENIDATE HCL 10 MG PO TABS: 10.0000 mg | ORAL_TABLET | Freq: Once | ORAL | Status: DC

## 2015-04-29 MED ORDER — SERTRALINE HCL 50 MG PO TABS: ORAL_TABLET | ORAL | Status: DC

## 2015-04-29 NOTE — Telephone Encounter (Signed)
Spouse called to request refills on sertraline (ZOLOFT) 50 MG tablet and methylphenidate (RITALIN) 10 MG tablet, states they are in [place] until April, is aware RITALIN is a controlled substance, wonders if this Rx could be faxed or mailed.

## 2015-04-30 ENCOUNTER — Telehealth: Payer: Self-pay

## 2015-04-30 NOTE — Telephone Encounter (Signed)
Attempted to call both numbers listed but both numbers are unavailable at this time, unable to leave messages at either number. RX for ritalin is available for pick up at the front desk.

## 2015-05-08 ENCOUNTER — Telehealth: Payer: Self-pay

## 2015-05-08 MED ORDER — METHYLPHENIDATE HCL 10 MG PO TABS: 10.0000 mg | ORAL_TABLET | Freq: Once | ORAL | Status: DC

## 2015-05-08 NOTE — Telephone Encounter (Signed)
I spoke to [place], NP and she said that it is our office policy to not mail or fax schedule II drugs. However, if Dr. Brett Fairy is agreeable to mailing the ritalin RX to [place], that is her discretion.  Please see below telephone message from pt's husband.

## 2015-05-08 NOTE — Telephone Encounter (Signed)
Dr. Brett Fairy mailed the ritalin prescription herself to the pt's [place] address of [address] [place] [address].  I spoke to pt's husband that Dr. Brett Fairy mailed the RX to them in [place].

## 2015-05-08 NOTE — Telephone Encounter (Signed)
Received a call from pt's husband. He asked me to mail the prescription for pt's Ritalin to [place]. I advised pt's husband that I cannot do that, we cannot mail any controlled substance prescriptions. He then asked that I fax the ritalin RX to a [place] [facility]. I again, explained that I cannot fax or mail a controlled substance prescription, and that the pt or a designated party must sign out the prescription at the front desk of our office.  After I explained this again, pt's husband became profane, demanding that I do it anyways, because there is no way he can get the prescription otherwise. I advised him that I would speak to our office management to ensure that my understanding of the policy is correct. Pt's husband profanely expressed to me that he does not care about our policy and he demands that I fax or mail the ritalin RX to him. Pt told me not to speak to management, because they will "just tell me that I can't fax or mail it", and instead, go to Dr. Brett Fairy and she will tell me to mail it to him because "Dr. Brett Fairy mailed it to me before." I again emphasized to the pt's husband that I will go to our office management to verify our policy and I will call him back.

## 2015-05-08 NOTE — Telephone Encounter (Signed)
Printed script and will ask to pick up.

## 2015-05-15 NOTE — Telephone Encounter (Signed)
Patient's husband is calling. He states his wife received the Rx for methylphenidate (RITALIN) 10 MG tablet in the mail and took it  to [facility] in [place], [place] telephone# [phone] and they were told a prior authorization is needed.

## 2015-05-15 NOTE — Telephone Encounter (Signed)
Pt for ritalin approved by [employer]. Effect date 03/22/2015 and expires 03/22/2016. Referral number: [id number]  Spoke to pt's husband and advised him of this. Pt's husband verbalized understanding.

## 2015-05-15 NOTE — Telephone Encounter (Signed)
Completed pa for ritalin. Awaiting determination.

## 2015-06-14 ENCOUNTER — Encounter: Payer: Self-pay | Admitting: Gastroenterology

## 2015-07-11 ENCOUNTER — Telehealth: Payer: Self-pay

## 2015-07-11 ENCOUNTER — Ambulatory Visit (INDEPENDENT_AMBULATORY_CARE_PROVIDER_SITE_OTHER): Payer: Medicare HMO | Admitting: Neurology

## 2015-07-11 ENCOUNTER — Encounter: Payer: Self-pay | Admitting: Neurology

## 2015-07-11 VITALS — BP 110/70 | HR 88 | Resp 20 | Ht 62.0 in | Wt 146.0 lb

## 2015-07-11 DIAGNOSIS — F4325 Adjustment disorder with mixed disturbance of emotions and conduct: Secondary | ICD-10-CM | POA: Diagnosis not present

## 2015-07-11 DIAGNOSIS — F09 Unspecified mental disorder due to known physiological condition: Secondary | ICD-10-CM

## 2015-07-11 DIAGNOSIS — F0789 Other personality and behavioral disorders due to known physiological condition: Secondary | ICD-10-CM

## 2015-07-11 MED ORDER — SERTRALINE HCL 50 MG PO TABS: ORAL_TABLET | ORAL | Status: DC

## 2015-07-11 MED ORDER — METHYLPHENIDATE HCL 10 MG PO TABS: 10.0000 mg | ORAL_TABLET | Freq: Once | ORAL | Status: DC

## 2015-07-11 NOTE — Progress Notes (Signed)
[facility]   Provider:  Larey Seat, M D  Referring Provider: Katherina Mires, MD Primary Care Physician:  Angelina Pih, MD   Chief Complaint  Patient presents with  . Follow-up    crying alot, memory, rm 10, alone    HPI:  [name] is a 70 y.o. female , who Is seen here as a referral from Dr. Jonny Ruiz, now Dr. Prince Rome , now looking for another PCP-  for subjective short termmemory loss, word finding  and hypersomnia.     [name] herself introduced her symptoms as anxiety,  depression and decline in memory. She also had some weight loss associated and  she said that she is under a lot of stress.  She feels fatigued, she has tinnitus, changes in appetite disinterest in some activities sometimes, is confused but certainly feels forgetful, and she reports she has restless legs. The patient reports that she is certainly forgetful at times little confused. She has a normal by the same object 3 times in the supermarkets she has to keep lists at certain places to not forget sequences shopping etc. Sometimes she forgets with a left has been placed. Her husband has taken over all the financial paperwork, and she reports that he also keeps no doctor's appointments etc. on her calendar. He rolled over the last year. Her antidepressants were changed by her primary care physician after her old one retired, she had increased the Zoloft from 35 to 100 mg , but felt her memory was not improved. Her husband tries to help her organize desks and kitchen cabinets , but she feels now more than ever that she cannot find what she looks for. This upsets her greatly.   She has regular bed times, at about 11 PM , reading in bed, falling asleep within 30 minutes or less. Her husband noted her legs kicking and twitching. She has no restless legs, however.  The patient is not aware of her movements, she acts out dreams now, yelling in her sleep , thrashing.  She denies visuial or auditory  hallucinations.  Her surgical history she endorsed a left knee surgery hysterectomy an anterior cervical spine fusion a tonsillectomy and appendectomy. The patient lives with her husband (from [place] ) her children have gone on to their own homes,  she drinks about 3 caffeinated beverages a day . Her mother is alive at 79 ( [place] her father lived to a ripe age of 57.   She's currently on Ativan  ( started last week ) as needed to initiate sleep but also to help with anxiety attacks and she takes Zoloft 100 mg a day.  The patient is appearing happy and conversant, but concerned. She reported crying spells before antidepressants were introduced 2001, she reported she had a nervous break down on 12-02-99.   Interval history : Patient brought her husband to today's appointment .  She had a normal EEG, and brain MRI .  We repeated her MOCA today, she scored  23 out of 30 points, lost 3 of the 5 recall words.  She still interchanges right and left, and her husband believes she has dyslexia. She displaces things around the house, including a Diamond ring in her house in the [place]  He described his wife as easily distracted and discouraged, and highly anxious.  I used the term mild cognitive impairment but would consider a trial of Aderall or Ritalin in this patient .  Would she be willing to  see Dr. Casimiro Needle. I would like for her to reduce the Zoloft to 50 mg daily. She is less sleepy on lesser Zoloft, sleeps 8 hours a day.    Interval history 10-11-14, [name] is here today for a repeat patient visits after 8 months. She has no bird last winter in [place] and has started taking a stimulant medication which she feels has helped her to concentrate and remained alert during the day. A reduction in Lexapro was also beneficial for her overall mood and alertness. Today be performed again a Montral cognitive assessment test and she scored 26 out of 30 points she was fully  oriented to time and place she recalled 3 out of 5 words on recall had no trouble with attention subtraction, association of language and obstruction. She named ALT 3 animals correctly and that the work fluency test score at 19 points. She did perfect on the clock drawing she was able to copy a treated three-dimensional image of acute. She failed the trail making test.  The patient presented with excessive daytime sleepiness reflected and an Epworth sleepiness score of 13 points.  There was a report of a dry mouth in the morning witnessed lo ud snoring and witnessed apneas by his spouse. She reports god apetite.  The patient's regular sleep routine is as follows; the patient normally goes to bed around 11 and falls asleep within 10-15 minutes he is awoken 3 times at night and nocturia and is woken by his alarm clock at 6 in the morning. 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  07-11-2015 Patient tearful, MOCA severely impaired, she is highly anxious.  Could not finish the trail making test. Soul not longer drive.  Montreal Cognitive Assessment  07/11/2015 02/06/2014  Visuospatial/ Executive (0/5) 3 4  Naming (0/3) 3 3  Attention: Read list of digits (0/2) 1 1  Attention: Read list of letters (0/1) 1 1  Attention: Serial 7 subtraction starting at 100 (0/3) 1 2  Language: Repeat phrase (0/2) 0 2  Language : Fluency (0/1) 1 1  Abstraction (0/2) 2 1  Delayed Recall (0/5) 0 2  Orientation (0/6) 6 6  Total 18 23  Adjusted Score (based on education) 18 -       Review of Systems:  Out of a complete 14 system review, the patient complains of only the following symptoms, and all other reviewed systems are negative.  I cannot focus- I go to the kitchen and forgot why. More  evidence of word finding in a conversation, trouble with appointments.  More crying spells, very anxious. Just returned 2 days ago    She endorsed the geriatric depression score at 2 points , MMSE last visit  27/30 points.  MOCA 18 , last visit  23-30 points.  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      Social History   Social History  . Marital Status: Married    Spouse Name: N/A  . Number of Children: 2  . Years of Education: hs   Occupational History  . Retired    Social History Main Topics  . Smoking status: Never Smoker   . Smokeless tobacco: Never Used  . Alcohol Use: No     Comment: occ (glass of wine)  . Drug Use: No  . Sexual Activity: Not on file   Other Topics Concern  . Not on file   Social History Narrative   Right handed.   Caffeine 3 cups  daily.  Married, 2 kids.  College.  Retired.      Family History  Problem Relation Age of Onset  . Colon cancer Neg Hx     Past Medical History  Diagnosis Date  . Depression   . Anxiety   . Tinnitus     Past Surgical History  Procedure Laterality Date  . Abdominal hysterectomy    . Appendectomy    . Tonsillectomy    . Cervical spine surgery    . Knee surgery Right   . Mouth surgery      roof of mouth    Current Outpatient Prescriptions  Medication Sig Dispense Refill  . methylphenidate (RITALIN) 10 MG tablet Take 1 tablet (10 mg total) by mouth once. daily 90 tablet 0  . sertraline (ZOLOFT) 50 MG tablet TAKE ONE-HALF TABLE BY MOUTH IN A.M. 45 tablet 0   No current facility-administered medications for this visit.    Allergies as of 07/11/2015 - Review Complete 07/11/2015  Allergen Reaction Noted  . Ibuprofen Nausea And Vomiting 01/05/2014    Vitals: BP 110/70 mmHg  Pulse 88  Resp 20  Ht 5\' 2"  (1.575 m)  Wt 146 lb (66.225 kg)  BMI 26.70 kg/m2 Last Weight:  Wt Readings from Last 1 Encounters:  07/11/15 146 lb (66.225 kg)       Last Height:   Ht Readings from Last 1 Encounters:  07/11/15 5\' 2"  (1.575 m)    Physical exam:  General: The patient is awake, alert and appears not in acute distress. The patient is well groomed. Head: Normocephalic, atraumatic. Neck is supple. Mallampati 2 She repo,    neck circumference: 13.5 inches . Nasal airflow unrestricted  TMJ is not evident . Retrognathia is not seen.  Cardiovascular:  Regular rate and rhythm , without  murmurs or carotid bruit, and without distended neck veins. Respiratory: Lungs are clear to auscultation. Skin:  Without evidence of edema, or rash Trunk: BMI elevated , normal posture.  Neurologic exam : The patient is awake and alert, oriented to place and time.  She has a fear of people talking about her.  She is worried about a miscommunication, during her recent [place] stay. Feels cut off by formerly close friends, cannot stop worrying about this.  Memory subjective  described as impaired, inattentive, unfocussed.  There is a normal attention span & concentration ability. Speech is fluent with aphasia. In her interview she was looking for words.  Mood and affect are high strung , tearful, a little agitated. She seems depressed- believes she harmed someone due to being  disinhibited with comments. Cranial nerves: Pupils are equal and briskly reactive to light. Funduscopic exam without evidence of pallor or edema.  Extraocular movements  in vertical and horizontal planes intact and without nystagmus. Visual fields by finger perimetry are intact. Hearing to finger rub intact.  Facial sensation intact to fine touch. Facial motor strength is symmetric and tongue and uvula move midline. Motor exam: Normal tone, muscle bulk and symmetric, strength in all extremities.  Sensory:  Fine touch, pinprick and vibration were tested in all extremities.  Proprioception is normal. Coordination: Rapid alternating movements in the fingers/hands is normal.  Finger-to-nose maneuver normal without evidence of ataxia, dysmetria or tremor. Gait and station: Patient walks without assistive device . Strength within normal limits. Stance is stable and normal. Tandem gait is unfragmented.  Romberg testing is negative. Deep tendon reflexes: in the  upper  and lower extremities are symmetric and intact. Babinski maneuver  response is  downgoing.   Assessment:  After physical and neurologic examination, review of laboratory studies, imaging, neurophysiology testing and pre-existing records, assessment is progression of COGNITIVE DYSFUNCTION.  1) Mild cognitive impairment, but this can be caused by medication - benzodiazepines and sleep aids can affect her memory next morning. She will d/c her sleep aid.  2) SSRI Zoloft , at first Anxiety and fatigue have improved after this medication was prescribed.  I like for her to go to 25 mg daily.  3) MOCA was added but she is too anxious and upset to score high.  18 points, see above. Should not drive - too upset, possibly impaired impulse control.  On 02-06-14 , MOCA was 23-30 points.  26-30 points on 10-11-14    The patient was advised of the nature of the diagnosed disorder , the treatment options and risks for general a heNDalth and wellness arising from not treating the condition. She is short term memory impaired/  She is anxious, much more than in our last visit. Visit duration was 30 minutes.   Plan:  Treatment plan and additional workup : Continue melatonin.  Zoloft to 50  mg daily upon patient's request. Continue Ritalin.  Rv in 2-3 month to establish a reliable MOCA    Larey Seat MD  07/11/2015

## 2015-07-11 NOTE — Telephone Encounter (Signed)
Spoke to pt and advised her that her ritalin and zoloft RX are available for pick up at the front desk. Pt verbalized understanding. I gave her clinic hours for pick up tomorrow. Pt verbalized understanding.

## 2015-07-30 ENCOUNTER — Telehealth: Payer: Self-pay | Admitting: Neurology

## 2015-07-30 NOTE — Telephone Encounter (Signed)
Mr. Gabrielson spoke to me today, he feels that [name]'s  short memory is declining, her emotional responses are uncontrolled, NUDEXTRA. ?  He will join her to next visit.  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 Shalaine Payson.

## 2015-07-30 NOTE — Telephone Encounter (Signed)
Spouse Collier Salina called requesting to speak to Dr. Brett Fairy about wife's condition. Please call [phone].

## 2015-07-30 NOTE — Telephone Encounter (Signed)
She is depressed, distraught and feels guilty of behaving in way that may have disappointed a friend. This is depression. She was tearful.  Can she see a therapist ? A psychiatrist, may be Margaret Pyle?

## 2015-07-30 NOTE — Telephone Encounter (Signed)
I spoke to pt's husband and advised him that Dr. Brett Fairy recommends that pt see a therapist/ psychiatrist such as Dr. Casimiro Needle. Pt's husband states that they do not want to do that, they have tried to see a therapist/psrychiatrist and it didn't help. They also have seen Dr. Casimiro Needle before and "do not want to see him again."  Pt's husband is again insisting that Dr. Brett Fairy call him regarding the patient's condition. Pt's husband can be reached at [phone].

## 2015-07-30 NOTE — Telephone Encounter (Signed)
Spoke to pt's husband, Collier Salina, per [place]. Pt's husband reports that the pt is "down, depressed, having panic attacks, crying, tired, no patience." He wants to know "what I can do to help her and what Dr. Brett Fairy can do to help her." He wants to know what to expect with pt's disease. I encouraged pt's husband to attend the office visit with pt with Dr. Brett Fairy on 6/7 and pt's husband states that he will come.  In the meantime, pt's husband is requesting that Dr. Brett Fairy call him at [phone] and discuss.

## 2015-08-07 ENCOUNTER — Telehealth: Payer: Self-pay | Admitting: *Deleted

## 2015-08-07 NOTE — Telephone Encounter (Signed)
PETER Blackson PA:[id number] [place] DOHMIER [phone] * [age] HAS A APPT ON JUNE 7TH/BUT SHE IS IN BAD SHAPE NOW/NEEDS TO SPEAK W/SOMEONE ABOUT HER CONDITION Y

## 2015-08-07 NOTE — Telephone Encounter (Signed)
Spoke to pt's husband. Pt's condition is "worsening" and he wants a sooner appt than June 7th. I offered him an appt tomorrow 08/08/15 at 3:30 and he accepted. Pt's husband knows that he should be present for this appt and to arrive 15 minutes early.

## 2015-08-08 ENCOUNTER — Encounter: Payer: Self-pay | Admitting: Neurology

## 2015-08-08 ENCOUNTER — Ambulatory Visit (INDEPENDENT_AMBULATORY_CARE_PROVIDER_SITE_OTHER): Payer: Medicare HMO | Admitting: Neurology

## 2015-08-08 VITALS — BP 112/84 | HR 82 | Resp 20 | Ht 62.0 in | Wt 147.0 lb

## 2015-08-08 DIAGNOSIS — G3184 Mild cognitive impairment, so stated: Secondary | ICD-10-CM | POA: Diagnosis not present

## 2015-08-08 DIAGNOSIS — F329 Major depressive disorder, single episode, unspecified: Secondary | ICD-10-CM | POA: Insufficient documentation

## 2015-08-08 DIAGNOSIS — F32A Depression, unspecified: Secondary | ICD-10-CM | POA: Insufficient documentation

## 2015-08-08 NOTE — Progress Notes (Signed)
[facility]   Provider:  Larey [name], M D  Referring Provider: Katherina Mires, MD Primary Care Physician:  Amy Pih, MD   Chief Complaint  Patient presents with  . Follow-up    pt and husband are refusing test, husband is especially resistent to memory test, rm 87, with husband    HPI:  [name] is a 70 y.o. female , who Is seen here as a referral from [name], now Dr. Prince [name] , now looking for another PCP-  for subjective short termmemory loss, word finding  and hypersomnia.     [name] herself introduced her symptoms as anxiety,  depression and decline in memory. She also had some weight loss associated and  she said that she is under a lot of stress.  She feels fatigued, she has tinnitus, changes in appetite disinterest in some activities sometimes, is confused but certainly feels forgetful, and she reports she has restless legs. The patient reports that she is certainly forgetful at times little confused. She has a normal by the same object 3 times in the supermarkets she has to keep lists at certain places to not forget sequences shopping etc. Sometimes she forgets with a left has been placed. Her husband has taken over all the financial paperwork, and she reports that he also keeps no doctor's appointments etc. on her calendar. He rolled over the last year. Her antidepressants were changed by her primary care physician after her old one retired, she had increased the Zoloft from 43 to 100 mg , but felt her memory was not improved. Her husband tries to help her organize desks and kitchen cabinets , but she feels now more than ever that she cannot find what she looks for. This upsets her greatly.   She has regular bed times, at about 11 PM , reading in bed, falling asleep within 30 minutes or less. Her husband noted her legs kicking and twitching. She has no restless legs, however.  The patient is not aware of her movements, she acts out dreams now,  yelling in her sleep , thrashing.  She denies visuial or auditory hallucinations.  Her surgical history she endorsed a left knee surgery hysterectomy an anterior cervical spine fusion a tonsillectomy and appendectomy. The patient lives with her husband (from [place] ) her children have gone on to their own homes,  she drinks about 3 caffeinated beverages a day . Her mother is alive at 63 ( [place] her father lived to a ripe age of 83.   She's currently on Ativan  ( started last week ) as needed to initiate sleep but also to help with anxiety attacks and she takes Zoloft 100 mg a day.  The patient is appearing happy and conversant, but concerned. She reported crying spells before antidepressants were introduced 2001, she reported she had a nervous break down on 12-02-99.   Interval history : Patient brought her husband to today's appointment .  She had a normal EEG, and brain MRI .  We repeated her MOCA today, she scored  23 out of 30 points, lost 3 of the 5 recall words.  She still interchanges right and left, and her husband believes she has dyslexia. She displaces things around the house, including a Diamond ring in her house in the [place]  He described his wife as easily distracted and discouraged, and highly anxious.  I used the term mild cognitive impairment but would consider a trial of Aderall or  Ritalin in this patient .  Would she be willing to see [name]. I would like for her to reduce the Zoloft to 50 mg daily. She is less sleepy on lesser Zoloft, sleeps 8 hours a day.    Interval history 10-11-14, [name] is here today for a repeat patient visits after 8 months. She has no bird last winter in [place] and has started taking a stimulant medication which she feels has helped her to concentrate and remained alert during the day. A reduction in Lexapro was also beneficial for her overall mood and alertness. Today be performed again a Montral  cognitive assessment test and she scored 26 out of 30 points she was fully oriented to time and place she recalled 3 out of 5 words on recall had no trouble with attention subtraction, association of language and obstruction. She named ALT 3 animals correctly and that the work fluency test score at 19 points. She did perfect on the clock drawing she was able to copy a treated three-dimensional image of acute. She failed the trail making test.  The patient presented with excessive daytime sleepiness reflected and an Epworth sleepiness score of 13 points.  There was a report of a dry mouth in the morning witnessed lo ud snoring and witnessed apneas by his spouse. She reports god apetite.  The patient's regular sleep routine is as follows; the patient normally goes to bed around 11 and falls asleep within 10-15 minutes he is awoken 3 times at night and nocturia and is woken by his alarm clock at 6 in the morning. 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  07-11-2015 Patient tearful, MOCA severely impaired, she is highly anxious.  Could not finish the trail making test. Soul not longer drive.    08-08-2015 [name] here today for follow-up. My original plan was to obtain a cognitive test, but [name] is quite upset and anxious today and in the state of agitation at test is likely less reliable than necessary. She has also with her husband being the caretaker of her elderly mother who has been visiting for the last month. Her mother can communicate to [name] and [name] but not to her husband. [name]. feels a little bit guilty, her mother is argumentative and aggressive, and she cannot longer tolerate it. Her mother is suprisingly sharp minded and healthy, but she causes [name]. To melt down. We reviewed together the MRI scan, and I feel strongly that [name] emotional state prohibits a normal resolution of cognitive tests. I would like to refer her to a female  psychiatrist and I suggested [name].          Montreal Cognitive Assessment  07/11/2015 02/06/2014  Visuospatial/ Executive (0/5) 3 4  Naming (0/3) 3 3  Attention: Read list of digits (0/2) 1 1  Attention: Read list of letters (0/1) 1 1  Attention: Serial 7 subtraction starting at 100 (0/3) 1 2  Language: Repeat phrase (0/2) 0 2  Language : Fluency (0/1) 1 1  Abstraction (0/2) 2 1  Delayed Recall (0/5) 0 2  Orientation (0/6) 6 6  Total 18 23  Adjusted Score (based on education) 18 -   The patient was tearful during the testing in April.     Review of Systems:  Out of a complete 14 system review, the patient complains of only the following symptoms, and all other reviewed systems are negative.  I cannot focus- I go to the kitchen and forgot why. More  evidence of word finding in a conversation, trouble with appointments. Trouble interpersonally with friend and mother.  More crying spells, very anxious.   She endorsed the geriatric depression score at 2 points , MMSE last visit 27/30 points.  MOCA was 18 , last visit  Not longer MCI- but possible  early dementia, possible pseudo dementia with major depression .      Social History   Social History  . Marital Status: Married    Spouse Name: N/A  . Number of Children: 2  . Years of Education: hs   Occupational History  . Retired    Social History Main Topics  . Smoking status: Never Smoker   . Smokeless tobacco: Never Used  . Alcohol Use: No     Comment: occ (glass of wine)  . Drug Use: No  . Sexual Activity: Not on file   Other Topics Concern  . Not on file   Social History Narrative   Right handed.   Caffeine 3 cups daily.  Married, 2 kids.  College.  Retired.      Family History  Problem Relation Age of Onset  . Colon cancer Neg Hx     Past Medical History  Diagnosis Date  . Depression   . Anxiety   . Tinnitus     Past Surgical History  Procedure Laterality Date  . Abdominal  hysterectomy    . Appendectomy    . Tonsillectomy    . Cervical spine surgery    . Knee surgery Right   . Mouth surgery      roof of mouth    Current Outpatient Prescriptions  Medication Sig Dispense Refill  . methylphenidate (RITALIN) 10 MG tablet Take 1 tablet (10 mg total) by mouth once. daily 90 tablet 0  . sertraline (ZOLOFT) 50 MG tablet TAKE ONE-TABLE BY MOUTH IN A.M. 90 tablet 3   No current facility-administered medications for this visit.    Allergies as of 08/08/2015 - Review Complete 08/08/2015  Allergen Reaction Noted  . Ibuprofen Nausea And Vomiting 01/05/2014    Vitals: BP 112/84 mmHg  Pulse 82  Resp 20  Ht 5\' 2"  (1.575 m)  Wt 147 lb (66.679 kg)  BMI 26.88 kg/m2 Last Weight:  Wt Readings from Last 1 Encounters:  08/08/15 147 lb (66.679 kg)       Last Height:   Ht Readings from Last 1 Encounters:  08/08/15 5\' 2"  (1.575 m)    Physical exam:  General: The patient is awake, alert and appears not in acute distress. The patient is well groomed. Head: Normocephalic, atraumatic. Neck is supple. Mallampati 2 She repo,  neck circumference: 13.5 inches . Nasal airflow unrestricted  TMJ is not evident . Retrognathia is not seen.  Cardiovascular:  Regular rate and rhythm , without  murmurs or carotid bruit, and without distended neck veins. Respiratory: Lungs are clear to auscultation. Skin:  Without evidence of edema, or rash Trunk: BMI elevated , normal posture.  Neurologic exam : The patient is awake and alert, oriented to place and time.  She has a fear of people talking about her.  She is worried about a miscommunication, during her recent [place] stay. Feels cut off by formerly close friends, cannot stop worrying about this.  Memory subjective  described as impaired, inattentive, unfocussed.  There is a normal attention span & concentration ability. Speech is fluent with aphasia. In her interview she  was looking for words.  Mood and affect are high strung ,  tearful, a little agitated. She seems depressed- believes she harmed someone due to being  disinhibited with comments. Cranial nerves: Pupils are equal and briskly reactive to light. Funduscopic exam without evidence of pallor or edema.  Extraocular movements  in vertical and horizontal planes intact and without nystagmus. Visual fields by finger perimetry are intact. Hearing to finger rub intact.  Facial sensation intact to fine touch. Facial motor strength is symmetric and tongue and uvula move midline. Motor exam: Normal tone, muscle bulk and symmetric, strength in all extremities.  Sensory:  Fine touch, pinprick and vibration were tested in all extremities.  Proprioception is normal. Coordination: Rapid alternating movements in the fingers/hands is normal.  Finger-to-nose maneuver normal without evidence of ataxia, dysmetria or tremor. Gait and station: Patient walks without assistive device . Strength within normal limits. Stance is stable and normal. Tandem gait is unfragmented.  Romberg testing is negative. Deep tendon reflexes: in the  upper and lower extremities are symmetric and intact. Babinski maneuver response is  downgoing.   Assessment:  After physical and neurologic examination, review of laboratory studies, imaging, neurophysiology testing and pre-existing records, assessment is progression of COGNITIVE DYSFUNCTION and mental changes .    The patient was advised of the nature of the diagnosed disorder , the treatment options and risks for general a heNDalth and wellness arising from not treating the condition. She is short term memory impaired/  She is anxious, much more than in our last visit. Visit duration was 30 minutes.   Plan:  Treatment plan and additional workup : Continue melatonin.  Zoloft to 50  mg daily upon patient's request. Rv in 2-3 month after psychiatric visit.    Asencion Partridge Alane Hanssen MD  08/08/2015

## 2015-08-09 ENCOUNTER — Ambulatory Visit (INDEPENDENT_AMBULATORY_CARE_PROVIDER_SITE_OTHER): Payer: Medicare HMO | Admitting: Gastroenterology

## 2015-08-09 ENCOUNTER — Encounter: Payer: Self-pay | Admitting: Gastroenterology

## 2015-08-09 ENCOUNTER — Other Ambulatory Visit (INDEPENDENT_AMBULATORY_CARE_PROVIDER_SITE_OTHER): Payer: Medicare HMO

## 2015-08-09 VITALS — BP 100/60 | HR 62 | Ht 62.0 in | Wt 148.0 lb

## 2015-08-09 DIAGNOSIS — G8929 Other chronic pain: Secondary | ICD-10-CM

## 2015-08-09 DIAGNOSIS — K219 Gastro-esophageal reflux disease without esophagitis: Secondary | ICD-10-CM | POA: Diagnosis not present

## 2015-08-09 DIAGNOSIS — R195 Other fecal abnormalities: Secondary | ICD-10-CM

## 2015-08-09 DIAGNOSIS — R1013 Epigastric pain: Secondary | ICD-10-CM | POA: Diagnosis not present

## 2015-08-09 LAB — CBC WITH DIFFERENTIAL/PLATELET
BASOS ABS: 0.1 10*3/uL (ref 0.0–0.1)
BASOS PCT: 1.3 % (ref 0.0–3.0)
EOS ABS: 0 10*3/uL (ref 0.0–0.7)
Eosinophils Relative: 0.6 % (ref 0.0–5.0)
HCT: 41.4 % (ref 36.0–46.0)
HEMOGLOBIN: 13.6 g/dL (ref 12.0–15.0)
LYMPHS PCT: 40.3 % (ref 12.0–46.0)
Lymphs Abs: 1.9 10*3/uL (ref 0.7–4.0)
MCHC: 33 g/dL (ref 30.0–36.0)
MCV: 81.9 fl (ref 78.0–100.0)
MONO ABS: 0.3 10*3/uL (ref 0.1–1.0)
Monocytes Relative: 6.9 % (ref 3.0–12.0)
Neutro Abs: 2.4 10*3/uL (ref 1.4–7.7)
Neutrophils Relative %: 50.9 % (ref 43.0–77.0)
Platelets: 361 10*3/uL (ref 150.0–400.0)
RBC: 5.05 Mil/uL (ref 3.87–5.11)
RDW: 14.1 % (ref 11.5–15.5)
WBC: 4.8 10*3/uL (ref 4.0–10.5)

## 2015-08-09 MED ORDER — RANITIDINE HCL 150 MG PO TABS: 150.0000 mg | ORAL_TABLET | Freq: Two times a day (BID) | ORAL | Status: DC

## 2015-08-09 NOTE — Progress Notes (Signed)
HPI :  70 y/o female here for a follow up visit. She is new to me, formerly seen by Dr. Deatra Ina in 2015.  She was previosly seen for diarrhea which has intervally resolved, she has no complaints regarding this at present time. Normal bowel habits, no blood in the stools. She previously had a (+) FOBT in 2015, shortly after her last colonoscopy. Dr. Deatra Ina at the time recommended repeating the stool study given her recent colonoscopy, which was not done.   She was previously seen for stomach discomfort and tested positive for H pylori last year. Treatment of H pylori improved the symptoms..  At present she has had intermittent symptoms which have bothered her, that come and go. She describes a burning sensation in her epigastric area, which can radiate up into her chest. She endorses pyrosis and regurgitation as well. No dysphagia. No odynophagia. No nausea or vomiting. Eating does not worsen her symptoms, or improve it. She is not sure how long this bothers her, it can vary. She wakes at times from regurgitation. Weight is stable. She denies early satiety. She endorses considerable anxiety at present time. She states she took nexium for one-2 weeks and it resolved her symptoms completely. The last time she took the nexium was the beginning of April and symptoms since came back. She reports symptoms last a few days at a time and then resolves. No FH of gastric cancer. TUMS and zantac helps as well, she has also tried these.   Colonoscopy 08/2012 per Dr. Amedeo Plenty [facility], was normal  Past Medical History  Diagnosis Date  . Depression   . Anxiety   . Tinnitus      Past Surgical History  Procedure Laterality Date  . Abdominal hysterectomy    . Appendectomy    . Tonsillectomy    . Cervical spine surgery    . Knee surgery Right   . Mouth surgery      roof of mouth   Family History  Problem Relation Age of Onset  . Colon cancer Neg Hx    Social History  Substance Use Topics  . Smoking  status: Never Smoker   . Smokeless tobacco: Never Used  . Alcohol Use: No     Comment: occ (glass of wine)   Current Outpatient Prescriptions  Medication Sig Dispense Refill  . methylphenidate (RITALIN) 10 MG tablet Take 1 tablet (10 mg total) by mouth once. daily 90 tablet 0  . sertraline (ZOLOFT) 50 MG tablet TAKE ONE-TABLE BY MOUTH IN A.M. 90 tablet 3   No current facility-administered medications for this visit.   Allergies  Allergen Reactions  . Ibuprofen Nausea And Vomiting     Review of Systems: All systems reviewed and negative except where noted in HPI.   Lab Results  Component Value Date   WBC 4.8 08/09/2015   HGB 13.6 08/09/2015   HCT 41.4 08/09/2015   MCV 81.9 08/09/2015   PLT 361.0 08/09/2015    Lab Results  Component Value Date   CREATININE 0.79 01/05/2014   BUN 14 01/05/2014   NA 137 01/05/2014   K 5.1 01/05/2014   CL 100 01/05/2014   CO2 28 01/05/2014    Lab Results  Component Value Date   ALT 24 01/05/2014   AST 22 01/05/2014   ALKPHOS 74 01/05/2014   BILITOT 0.2 01/05/2014     Physical Exam: BP 100/60 mmHg  Pulse 62  Ht 5\' 2"  (1.575 m)  Wt 148 lb (67.132 kg)  BMI 27.06 kg/m2 Constitutional: Pleasant,well-developed, female in no acute distress. HEENT: Normocephalic and atraumatic. Conjunctivae are normal. No scleral icterus. Neck supple.  Cardiovascular: Normal rate, regular rhythm.  Pulmonary/chest: Effort normal and breath sounds normal. No wheezing, rales or rhonchi. Abdominal: Soft, nondistended, nontender. Bowel sounds active throughout. There are no masses palpable. No hepatomegaly. Extremities: no edema Lymphadenopathy: No cervical adenopathy noted. Neurological: Alert and oriented to person place and time. Skin: Skin is warm and dry. No rashes noted. Psychiatric: Normal mood and affect. Behavior is normal.   ASSESSMENT AND PLAN: 70 y/o female presenting with reported history of H pylori infection, presenting with pyrosis  and regurgitation in the setting of intermittent epigastric discomfort. No alarm symptoms or anemia. She also has significant anxiety which she thinks is making symptoms worse. She has responded appropriately to trials of both Zantac and nexium in the past. I think her constellation of symptoms is likely due to untreated GERD (+/- esophagitis?). Given her H pylori history s/p therapy, will check a stool study to ensure she has had this eradicated and does not need further therapy. I discussed GERD with them in general and treatment options. Given her response to zantac previously and better safety profile compared to PPIs, will try zantac 150mg  BID for a few weeks to start and see how she feels. If this resolves her symptoms she can take it PRN. If symptoms persist, I asked her to contact me for further assessment, may consider EGD and escalate to PPI at that time.   In the interim, review of her chart shows prior FOBT was positive shortly after she had her last colonoscopy, which was reportedly normal. I repeated a CBC today and it was normal. I think a missed polyp or cancer on her last exam is very unlikely, but I would repeat FOBT to ensure normal at this time. She agreed.   Nichols Cellar, MD [facility] Pager [phone]

## 2015-08-09 NOTE — Patient Instructions (Addendum)
If you are age 68 or older, your body mass index should be between 23-30. Your Body mass index is 27.06 kg/(m^2). If this is out of the aforementioned range listed, please consider follow up with your Primary Care Provider.  If you are age 34 or younger, your body mass index should be between 19-25. Your Body mass index is 27.06 kg/(m^2). If this is out of the aformentioned range listed, please consider follow up with your Primary Care Provider.   Your physician has requested that you go to the basement for lab work before leaving today.  Please DON'T start the Zantac until after you have done your stool studies.  We have sent the following medications to your pharmacy for you to pick up at your convenience: Zantac  Thank you for choosing [facility]  Dr [place] Cellar

## 2015-08-12 ENCOUNTER — Telehealth: Payer: Self-pay | Admitting: *Deleted

## 2015-08-12 DIAGNOSIS — G3184 Mild cognitive impairment, so stated: Secondary | ICD-10-CM

## 2015-08-12 NOTE — Telephone Encounter (Signed)
[name]               CID [id number]  Patient [name]         Pt's Dr [facility]      Area Code [id number] [address] [facility] BUT  NOT IN [place]                                Disp:Y/N Y If Y = C/B If No Response In 51minutes ============================================================

## 2015-08-12 NOTE — Telephone Encounter (Signed)
Hinton Dyer, can you find a psychiatrist that is in [employer]'s network? Apparently Dr. Casimiro Needle is not in network.

## 2015-08-13 NOTE — Telephone Encounter (Signed)
Order placed

## 2015-08-13 NOTE — Telephone Encounter (Addendum)
Cyril Mourning will you place a new referral last referral 2015 . I can get her set up with [facility] [phone]. Thanks Hinton Dyer

## 2015-08-14 NOTE — Telephone Encounter (Signed)
Called and spoke to husband he has address and telephone number where patient is going. [phone].

## 2015-08-20 ENCOUNTER — Telehealth: Payer: Self-pay | Admitting: Gastroenterology

## 2015-08-20 ENCOUNTER — Other Ambulatory Visit: Payer: Medicare HMO

## 2015-08-20 DIAGNOSIS — K219 Gastro-esophageal reflux disease without esophagitis: Secondary | ICD-10-CM

## 2015-08-20 NOTE — Telephone Encounter (Signed)
Left a message for patient's husband to call back.  

## 2015-08-20 NOTE — Telephone Encounter (Signed)
Called lab to verify stool has been turned in. Per lab patient turned in stool cards. She turned in the cup for stool with urine in it. ( test was for h. Pylori stool)Called patient's husband and he has already given her a Zantac.

## 2015-08-20 NOTE — Telephone Encounter (Signed)
Patient's husband states patient turned in the stool studies today. He will start the Zantac.

## 2015-08-20 NOTE — Telephone Encounter (Signed)
Is her stool study valid? If not she should submit it again prior to starting Zantac. If valid then will await the result. Thanks

## 2015-08-21 ENCOUNTER — Other Ambulatory Visit (INDEPENDENT_AMBULATORY_CARE_PROVIDER_SITE_OTHER): Payer: Medicare HMO

## 2015-08-21 DIAGNOSIS — R1013 Epigastric pain: Secondary | ICD-10-CM

## 2015-08-21 DIAGNOSIS — K219 Gastro-esophageal reflux disease without esophagitis: Secondary | ICD-10-CM

## 2015-08-21 LAB — FECAL OCCULT BLOOD, IMMUNOCHEMICAL: FECAL OCCULT BLD: NEGATIVE

## 2015-08-21 NOTE — Telephone Encounter (Signed)
Per Dr. Havery Moros, hold Zantac for 2 weeks then get specimen. Patient's husband aware.

## 2015-08-28 ENCOUNTER — Ambulatory Visit: Payer: Medicare HMO | Admitting: Neurology

## 2015-09-02 ENCOUNTER — Other Ambulatory Visit: Payer: Medicare HMO

## 2015-09-02 DIAGNOSIS — K219 Gastro-esophageal reflux disease without esophagitis: Secondary | ICD-10-CM

## 2015-09-05 LAB — H. PYLORI ANTIGEN, STOOL: H PYLORI AG STL: NEGATIVE

## 2015-09-09 ENCOUNTER — Telehealth: Payer: Self-pay | Admitting: Gastroenterology

## 2015-09-09 NOTE — Telephone Encounter (Signed)
That's fine, we can try omeprazole 40mg  once daily and see if this helps. If symptoms persist despite zantac, given her age, an EGD would also be recommended. If she wishes to see how she does first with nexium first that's okay but would have low threshold to perform EGD. Thanks

## 2015-09-09 NOTE — Telephone Encounter (Signed)
Spoke with patient's husband and he states she is still having stomach pain. The Zantac BID is not helping. He is asking for Nexium rx because this helped in the past. Please, advise.

## 2015-09-09 NOTE — Telephone Encounter (Signed)
H. Pylori stool is negative. Left a message for patient's husband to call back.

## 2015-09-10 ENCOUNTER — Encounter (HOSPITAL_COMMUNITY): Payer: Self-pay | Admitting: Psychiatry

## 2015-09-10 ENCOUNTER — Encounter (INDEPENDENT_AMBULATORY_CARE_PROVIDER_SITE_OTHER): Payer: Self-pay

## 2015-09-10 ENCOUNTER — Ambulatory Visit (INDEPENDENT_AMBULATORY_CARE_PROVIDER_SITE_OTHER): Payer: Medicare HMO | Admitting: Psychiatry

## 2015-09-10 VITALS — BP 130/78 | HR 66 | Ht 64.0 in | Wt 148.6 lb

## 2015-09-10 DIAGNOSIS — F419 Anxiety disorder, unspecified: Secondary | ICD-10-CM | POA: Diagnosis not present

## 2015-09-10 DIAGNOSIS — F331 Major depressive disorder, recurrent, moderate: Secondary | ICD-10-CM

## 2015-09-10 MED ORDER — LAMOTRIGINE 25 MG PO TABS: ORAL_TABLET | ORAL | Status: DC

## 2015-09-10 MED ORDER — ESOMEPRAZOLE MAGNESIUM 40 MG PO CPDR: 40.0000 mg | DELAYED_RELEASE_CAPSULE | Freq: Every day | ORAL | Status: DC
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
## 2015-09-10 NOTE — Telephone Encounter (Signed)
Rx for Nexium sent to pharmacy per patient request. Left a message for patient that Rx has been sent and to call if symptom persist.

## 2015-10-01 ENCOUNTER — Ambulatory Visit (HOSPITAL_COMMUNITY): Payer: Medicare HMO | Admitting: Psychiatry

## 2015-10-02 ENCOUNTER — Other Ambulatory Visit (HOSPITAL_COMMUNITY): Payer: Medicare HMO | Attending: Psychiatry | Admitting: Psychiatry

## 2015-10-02 ENCOUNTER — Encounter (HOSPITAL_COMMUNITY): Payer: Self-pay | Admitting: Psychiatry

## 2015-10-02 DIAGNOSIS — F411 Generalized anxiety disorder: Secondary | ICD-10-CM

## 2015-10-02 DIAGNOSIS — F331 Major depressive disorder, recurrent, moderate: Secondary | ICD-10-CM | POA: Insufficient documentation

## 2015-10-02 NOTE — Progress Notes (Signed)
Daily Group Progress Note  Program: IOP   Group Time: 10:45-12:00pm   Participation Level: Minimal   Behavioral Response: Engaged   Type of Therapy:  Group therapy   Summary of Progress: Pt was with the case manager during most of the second half of group today. She did come in for a few minutes at the end and shared about her depression.   Jenkins Rouge, LCASA

## 2015-10-02 NOTE — Progress Notes (Deleted)
    Daily Group Progress Note  Program: {CHL AMB BH IOP/CDIOP Program Type:21022744}  Group Time:   Participation Level: {CHL AMB BH Group Participation:21022742}  Behavioral Response: {CHL AMB BH Group Behavior:21022743}  Type of Therapy:  {CHL AMB BH Type of Therapy:21022741}  Summary of Progress: ***     Group Time:   Participation Level:  {CHL AMB BH Group Participation:21022742}  Behavioral Response: {CHL AMB BH Group Behavior:21022743}  Type of Therapy: {CHL AMB BH Type of Therapy:21022741}  Summary of Progress: ***  [name],[name], Licensed Cli
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
## 2015-10-03 ENCOUNTER — Encounter (HOSPITAL_COMMUNITY): Payer: Self-pay | Admitting: Psychiatry

## 2015-10-03 ENCOUNTER — Other Ambulatory Visit (HOSPITAL_COMMUNITY): Payer: Medicare HMO | Admitting: Psychiatry

## 2015-10-03 DIAGNOSIS — F411 Generalized anxiety disorder: Secondary | ICD-10-CM | POA: Insufficient documentation

## 2015-10-03 DIAGNOSIS — F331 Major depressive disorder, recurrent, moderate: Secondary | ICD-10-CM | POA: Diagnosis not present

## 2015-10-03 NOTE — Progress Notes (Signed)
    Daily Group Progress Note  Program: IOP  Group Time: 9:00-10:30  Participation Level: Active  Behavioral Response: Appropriate  Type of Therapy:  Group Therapy  Summary of Progress: Pt. Presented with brightened affect. Pt. Introduced herself to the group and listened attentively for her first day. Pt. Met with Dr. Lovena Le during the group.     Nancie Neas, LPC
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
## 2015-10-04 ENCOUNTER — Other Ambulatory Visit (HOSPITAL_COMMUNITY): Payer: Medicare HMO | Admitting: Psychiatry

## 2015-10-04 DIAGNOSIS — F331 Major depressive disorder, recurrent, moderate: Secondary | ICD-10-CM

## 2015-10-04 NOTE — Progress Notes (Signed)
    Daily Group Progress Note  Program: IOP  Time: 9:00-12:00 Activity Level: active Behavioral Response: Type: Group Therapy Summary: Patient demonstrated feelings of guilt in group. She stated the her mother is a trigger, and that her mother's comments make her angry. She feels guilt for being upset with her mother, and she also feels guilt when she gets overwhelmed and takes it out on her husband. Therapist encouraged patient to ask herself "what do I know to be true about myself today?" as a coping tool to help her manage her mother's statements that she receives as judgmental.  Nancie Neas, LPC

## 2015-10-07 ENCOUNTER — Other Ambulatory Visit (HOSPITAL_COMMUNITY): Payer: Medicare HMO | Admitting: Licensed Clinical Social Worker

## 2015-10-07 DIAGNOSIS — F331 Major depressive disorder, recurrent, moderate: Secondary | ICD-10-CM

## 2015-10-07 DIAGNOSIS — F411 Generalized anxiety disorder: Secondary | ICD-10-CM

## 2015-10-07 NOTE — Progress Notes (Signed)
Daily Group Progress Note  Program: IOP  Group Time: 9:00-12:00  Participation Level: Active  Behavioral Response: Appropriate  Type of Therapy:  Psychoeducation/Group Therapy  Summary of Progress: Pt participated in a presentation by [facility] pharmacist on medication management and asked appropriate medication questions. The second part of group focused on a coping skill for depression. Pt participated in a discussion about how pets can be used as therapy. Pt shared how pets have assisted in diminishing depressive symptoms in the past. She does not have a pet now but after today's group pt wants to get a dog.

## 2015-10-07 NOTE — Progress Notes (Signed)
    Daily Group Progress Note  Program: IOP  Group Time: 9:00-12:00  Participation Level: Active  Behavioral Response: Appropriate  Type of Therapy:  Group Therapy  Summary of Progress: Pt. Presented as talkative, engaged in the group process. Pt. Was appropriately tearful regarding perceived hurt that she has caused her husband during her depression crisis. Pt. Participated in discussion about developing a practice of self-compassion to help cope with painful emotions. Pt. Provided feedback to discharging group member and to group member who is challenged by personal situation. Pt. Participated in discussion about developing a self-care routine.     Nancie Neas, LPC

## 2015-10-08 ENCOUNTER — Other Ambulatory Visit (HOSPITAL_COMMUNITY): Payer: Medicare HMO | Admitting: Licensed Clinical Social Worker

## 2015-10-08 ENCOUNTER — Ambulatory Visit (HOSPITAL_COMMUNITY): Payer: Self-pay | Admitting: Psychiatry

## 2015-10-08 DIAGNOSIS — F331 Major depressive disorder, recurrent, moderate: Secondary | ICD-10-CM | POA: Diagnosis not present

## 2015-10-08 DIAGNOSIS — F411 Generalized anxiety disorder: Secondary | ICD-10-CM

## 2015-10-08 NOTE — Progress Notes (Signed)
    Daily Group Progress Note  Program: IOP  Group Time: 9:00-11:00  Participation Level: Active  Behavioral Response: Appropriate  Type of Therapy:  Group Therapy  Summary of Progress: Pt. Presented with bright affect, talkative, engaged in the group process. Pt. Discussed that she was able to cook a meal over the weekend, but became distracted and burned the food. Pt. Was encouraged by the counselor and other group members that the attempt to make the meal was a significant achievement. Pt. Became tearful in response to another patient's discussion of the loss of her mother. Pt. Continues to feel anxiety related to difficulty of being only caregiver for her elderly mother [place].      Eloise Levels, Ph.D., [facility]

## 2015-10-08 NOTE — Progress Notes (Signed)
Daily Group Progress Note  Program: IOP  Group Time: 10:45-12:00pm  Participation Level: Active  Behavioral Response: Appropriate  Type of Therapy:  Psychotherapy  Summary of Progress:  Pt participated in a discussion on healthy boundaries. Pt was encouraged to set personal boundaries with limits and rules, which encourages mental wellness.

## 2015-10-09 ENCOUNTER — Other Ambulatory Visit (HOSPITAL_COMMUNITY): Payer: Medicare HMO | Admitting: Psychiatry

## 2015-10-09 DIAGNOSIS — F331 Major depressive disorder, recurrent, moderate: Secondary | ICD-10-CM | POA: Diagnosis not present

## 2015-10-09 DIAGNOSIS — F411 Generalized anxiety disorder: Secondary | ICD-10-CM

## 2015-10-09 NOTE — Progress Notes (Signed)
Daily Group Progress Note  Program: IOP  Group Time: 10:45-12:00pm  Participation Level: Active  Behavioral Response: Appropriate  Type of Therapy:  Psychotherapy  Summary of Progress:  Pt participated in chair yoga, facilitated by [facility] therapist and certified yoga instructor Jan Fireman. The activity focused on breath with movement. For patients with anxiety and depression as part of self-care and mindfulness, yoga helps train the relaxation response.    Jenkins Rouge, LCAS-A

## 2015-10-09 NOTE — Progress Notes (Signed)
    Daily Group Progress Note  Program: IOP   Time: 9:00-10:45 Activity Level: active Behavioral Response: engaged, responsive Type: Group Therapy Summary: Pt. was very quiet in group today. Patient did share about her interest in the [employer], and related to group conversation.  Nancie Neas, LPC

## 2015-10-10 ENCOUNTER — Other Ambulatory Visit (HOSPITAL_COMMUNITY): Payer: Medicare HMO | Admitting: Psychiatry

## 2015-10-10 DIAGNOSIS — F331 Major depressive disorder, recurrent, moderate: Secondary | ICD-10-CM

## 2015-10-11 ENCOUNTER — Other Ambulatory Visit (HOSPITAL_COMMUNITY): Payer: Medicare HMO

## 2015-10-14 ENCOUNTER — Other Ambulatory Visit (HOSPITAL_COMMUNITY): Payer: Medicare HMO

## 2015-10-15 ENCOUNTER — Other Ambulatory Visit (HOSPITAL_COMMUNITY): Payer: Medicare HMO | Admitting: Licensed Clinical Social Worker

## 2015-10-15 DIAGNOSIS — F331 Major depressive disorder, recurrent, moderate: Secondary | ICD-10-CM | POA: Diagnosis not present

## 2015-10-15 NOTE — Progress Notes (Signed)
    Daily Group Progress Note  Program: IOP  Time: 9:00-12:00 Activity Level: active Behavioral Response: engaged, responsive Type: Group Therapy Summary: Patient seemed to have a cheerful theme in group today. Patient shared that she really enjoyed tennis as part of her self care. Therapist shared about I statements, and how they can improve our relationships. Patient considered how I statements could be beneficial in her relationship with her mother.   Nancie Neas, LPC

## 2015-10-16 ENCOUNTER — Other Ambulatory Visit (HOSPITAL_COMMUNITY): Payer: Medicare HMO | Admitting: Psychiatry

## 2015-10-16 DIAGNOSIS — F331 Major depressive disorder, recurrent, moderate: Secondary | ICD-10-CM | POA: Diagnosis not present

## 2015-10-16 NOTE — Progress Notes (Signed)
Daily Group Progress Note     Program:  IOP    Time: 10:45-12:00 Activity Level: active Behavioral Response: engaged, responsive Type: Group Summary: Therapist asked the group to share what others told them about their depression. Therapist asked the group to draw their depression, and to potentially share the drawing with those people. Patient shared that the activity was very helpful for her.   Jenkins Rouge, LCAS-A

## 2015-10-16 NOTE — Progress Notes (Signed)
    Daily Group Progress Note  Program: IOP   Time: 9:00-10:45 Activity Level: active Behavioral Response: engaged, responsive Type: Group Therapy Summary: Patient shared that she was having difficulty with sleep. Therapist educated group about proper sleep hygiene, and eating a proper diet. Patient shared that she was eating chocolate in secret. Therapist challenged her to enjoy this activity, instead of eating in secret out of shame. Therapist asked the group how they wanted to feel, and what behaviors were needed to make them feel that way.   Nancie Neas, LPC

## 2015-10-17 ENCOUNTER — Other Ambulatory Visit (HOSPITAL_COMMUNITY): Payer: Medicare HMO | Admitting: Psychiatry

## 2015-10-17 DIAGNOSIS — F331 Major depressive disorder, recurrent, moderate: Secondary | ICD-10-CM

## 2015-10-18 ENCOUNTER — Other Ambulatory Visit (HOSPITAL_COMMUNITY): Payer: Medicare HMO

## 2015-10-18 NOTE — Progress Notes (Signed)
    Daily Group Progress Note  Program: IOP  Group Time: 9:00-12:00   Participation Level:  active   Behavioral Response: engaged   Type of Therapy:   group therapy   Summary of Progress: Client discussed the expectations her mother has for her are incongruent with the expectations she has for herself, causing her anxiety.  Counselor encouraged client to examine her core beliefs, without reference to the opinions of others.  Client was concerned about her current medication.  Counselor discussed this with the psychiatrist and client was able to speak with psychiatrist about a potential medication change. The [place] discussed grief and loss with the group.  Nancie Neas, LPC

## 2015-10-18 NOTE — Progress Notes (Signed)
    Daily Group Progress Note  Program: IOP   Time: 9:00-12:00 Activity Level: active Behavioral Response: engaged, responsive Type: Group Therapy Summary: Patient presented with a calm theme in group. Patient shared that she often struggled to please her mother, and felt inferior when she did not meet her expectations. Therapist shared that sometimes we do the best we can with the resources that we have. Therapist showed a Clare Gandy talk by Almond Lint on vulnerability, and processed with group.   Nancie Neas, LPC

## 2015-10-21 ENCOUNTER — Other Ambulatory Visit (HOSPITAL_COMMUNITY): Payer: Medicare HMO

## 2015-10-22 ENCOUNTER — Other Ambulatory Visit (HOSPITAL_COMMUNITY): Payer: Medicare HMO | Attending: Psychiatry | Admitting: Licensed Clinical Social Worker

## 2015-10-22 DIAGNOSIS — F331 Major depressive disorder, recurrent, moderate: Secondary | ICD-10-CM | POA: Insufficient documentation

## 2015-10-22 DIAGNOSIS — F411 Generalized anxiety disorder: Secondary | ICD-10-CM | POA: Diagnosis not present

## 2015-10-23 ENCOUNTER — Other Ambulatory Visit (HOSPITAL_COMMUNITY): Payer: Medicare HMO | Admitting: Licensed Clinical Social Worker

## 2015-10-23 DIAGNOSIS — F411 Generalized anxiety disorder: Secondary | ICD-10-CM

## 2015-10-23 DIAGNOSIS — F331 Major depressive disorder, recurrent, moderate: Secondary | ICD-10-CM

## 2015-10-23 NOTE — Progress Notes (Signed)
Daily Group Progress Note  Program: IOP   Group Time: 10:45-12:00pm  Participation Level: Active  Behavioral Response: Appropriate  Type of Therapy:  Psychoeducation  Summary of Progress:  Pt participated in a discussion following the [employer], "Who you really need to marry" by Ardelle Lesches. Pt discussed the message of the video which is that you marry yourself through entering a relationship and commitment to yourself. Pt was encouraged to continue to work on gaining insight to issues of self-esteem and healthy relationships.   Alver Fisher, LCAS-A

## 2015-10-23 NOTE — Progress Notes (Signed)
    Daily Group Progress Note  Program: IOP  Group Time:   Participation Level:  Active  Behavioral Response: Appropriate  Type of Therapy: Group Therapy  Summary of Progress:  [name] presented with a positive theme in group. Patient shared that she thoroughly enjoyed her tennis match yesterday, and that she had a great day. Patient also shared that she enjoyed going out with friends over the weekend. Therapist encouraged her to keep incorporating self-care, and participating in the things that she enjoys. Patient participated in chair yoga, facilitated by [facility] therapist and certified yoga instructor Jan Fireman. The activity focused on breath and movement. As part of self-care and mindfulness, yoga helps train the relaxation response for patients with anxiety and depression.    Lorin Glass, LCSW

## 2015-10-24 ENCOUNTER — Other Ambulatory Visit (HOSPITAL_COMMUNITY): Payer: Medicare HMO | Admitting: Licensed Clinical Social Worker

## 2015-10-24 DIAGNOSIS — F331 Major depressive disorder, recurrent, moderate: Secondary | ICD-10-CM

## 2015-10-24 NOTE — Progress Notes (Signed)
    Daily Group Progress Note  Program: IOP  Group Time: 9 - 10:30 AM  Participation Level:  Active  Behavioral Response: Appropriate  Type of Therapy: Group Therapy  Summary of Progress:  Patient demonstrated a very cheerful theme in group today. Patient stated that she was feeling so much better. She is incorporating self-care, and healthy routines into her daily life. Therapist encouraged her to continue using self-care and enjoyable activities into her life.    Lorin Glass, LCSW

## 2015-10-24 NOTE — Progress Notes (Signed)
    Daily Group Progress Note  Program: IOP  Group Time: 9 AM - 12  Participation Level:  Active  Behavioral Response: Appropriate  Type of Therapy: Group Therapy  Summary of Progress:  Patient presented with a positive theme in group today. Patient shared about a painful relationship from this past year, and how it has negatively affected her depression. Therapist processed feelings around the hurt that she was feeling, and how to handle negative situations in the future. Patient participated in gratitude handout, and shared multiple things that she was grateful for in her life. Patient participated in group craft activity, and enjoyed doing something fun with fellow group members.     Lorin Glass, LCSW

## 2015-10-25 ENCOUNTER — Other Ambulatory Visit (HOSPITAL_COMMUNITY): Payer: Medicare HMO

## 2015-10-28 ENCOUNTER — Other Ambulatory Visit (HOSPITAL_COMMUNITY): Payer: Medicare HMO

## 2015-10-28 ENCOUNTER — Other Ambulatory Visit: Payer: Self-pay | Admitting: Family Medicine

## 2015-10-28 DIAGNOSIS — Z1231 Encounter for screening mammogram for malignant neoplasm of breast: Secondary | ICD-10-CM

## 2015-10-29 ENCOUNTER — Other Ambulatory Visit (HOSPITAL_COMMUNITY): Payer: Medicare HMO | Admitting: Licensed Clinical Social Worker

## 2015-10-29 DIAGNOSIS — F331 Major depressive disorder, recurrent, moderate: Secondary | ICD-10-CM

## 2015-10-29 DIAGNOSIS — F411 Generalized anxiety disorder: Secondary | ICD-10-CM

## 2015-10-29 NOTE — Progress Notes (Deleted)
   THERAPIST PROGRESS NOTE  Session Time: ***  Participation Level: {BHH PARTICIPATION LEVEL:22264}  Behavioral Response: {Appearance:22683}{BHH LEVEL OF CONSCIOUSNESS:22305}{BHH MOOD:22306}  Type of Therapy: {CHL AMB BH Type of Therapy:21022741}  Treatment Goals addressed: {CHL AMB BH Treatment Goals Addressed:21022754}  Interventions: {CHL AMB BH Type of Intervention:21022753}  Summary: [name] is a 70 y.o. female who presents with ***.   Suicidal/Homicidal: {BHH YES OR NO:22294}{yes/no/with/without intent/plan:22693}  Therapist Response: ***  Plan: Return again in *** weeks.  Diagnosis: Axis I: {psych axis 1:31909}    Axis II: {psych axis 2:31910}    Caitlyne Ingham, RITA 10/29/2015

## 2015-10-29 NOTE — Patient Instructions (Signed)
Patient completed MH-IOP today.  Will follow up with Audelia Acton, LCSW 11-13-15 @ 10 a.m and Dr. Adele Schilder on 12-13-15 @ 8:45 a.m.  Encouraged support groups.  Strongly recommended [facility] with Beth [phone]) every Tuesday 5-6 pm.

## 2015-10-29 NOTE — Progress Notes (Signed)
    Daily Group Progress Note  Program: IOP  Group Time: 9 AM - 12  Participation Level:  Active  Behavioral Response: Appropriate  Type of Therapy: Group Therapy  Summary of Progress:  Patient presented in a positive mood. Patient states continued difficulty with her memory. Clinician engaged patients in discussion regarding positive psychology and how to incorporate tents into daily life. Group discussed ways to increase socialization. Patient demonstrates progress as evidenced by stating decreased depression symptoms and increased ability to manage emotions. Patient denies SI/HI. Patient will discharge from IOP today.    Lorin Glass, LCSW
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
## 2015-10-29 NOTE — Progress Notes (Signed)
  Patient ID: [name], female   DOB: 1945-06-24, 70 y.o.   MRN: [id number] Discharge Note:    [name] has done well.  Stopping the Lamictal seemed to make a difference in a lot of her forgetfulness and scattered approach to routine things in her life.  Still has a lot of uncertainty and fear she will not remember things but not nearly as intense.  Mental status is consistent with anxiety, perhaps some mild cognitive impairment and no suicidal thoughts  Final diagnosis remains general anxiety disorder and major depression, recurrent mild  Plan is to follow up with Dr Adele Schilder and Audelia Acton, LCSW

## 2015-10-30 ENCOUNTER — Other Ambulatory Visit (HOSPITAL_COMMUNITY): Payer: Medicare HMO

## 2015-11-13 ENCOUNTER — Ambulatory Visit (INDEPENDENT_AMBULATORY_CARE_PROVIDER_SITE_OTHER): Payer: Medicare HMO | Admitting: Clinical

## 2015-11-13 DIAGNOSIS — F331 Major depressive disorder, recurrent, moderate: Secondary | ICD-10-CM

## 2015-11-20 ENCOUNTER — Encounter (HOSPITAL_COMMUNITY): Payer: Self-pay | Admitting: Clinical
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
## 2015-11-26 ENCOUNTER — Ambulatory Visit (HOSPITAL_COMMUNITY): Payer: Medicare HMO | Admitting: Licensed Clinical Social Worker

## 2015-12-02 ENCOUNTER — Telehealth: Payer: Self-pay | Admitting: Gastroenterology

## 2015-12-02 MED ORDER — ESOMEPRAZOLE MAGNESIUM 40 MG PO CPDR: 40.0000 mg | DELAYED_RELEASE_CAPSULE | Freq: Every day | ORAL | 3 refills | Status: DC

## 2015-12-02 NOTE — Telephone Encounter (Signed)
Pt has been notified of the recommendations and will try zantac at bedtime.  She will call for f/u when back from her trip in October.

## 2015-12-02 NOTE — Telephone Encounter (Signed)
Per Dr Havery Moros last office note; "Given her response to zantac previously and better safety profile compared to PPIs, will try zantac 150mg  BID for a few weeks to start and see how she feels. If this resolves her symptoms she can take it PRN"  Left message on machine to call back regarding sent prescription and recommendation to try zantac at bedtime as needed per Dr Luvenia Starch.

## 2015-12-04 ENCOUNTER — Ambulatory Visit
Admission: RE | Admit: 2015-12-04 | Discharge: 2015-12-04 | Disposition: A | Discharge status: Home / Self Care | Payer: Medicare HMO | Source: Ambulatory Visit | Attending: Family Medicine | Admitting: Family Medicine

## 2015-12-04 DIAGNOSIS — Z1231 Encounter for screening mammogram for malignant neoplasm of breast: Secondary | ICD-10-CM

## 2015-12-05 ENCOUNTER — Telehealth (HOSPITAL_COMMUNITY): Payer: Self-pay | Admitting: Licensed Clinical Social Worker

## 2015-12-13 ENCOUNTER — Ambulatory Visit (HOSPITAL_COMMUNITY): Payer: Self-pay | Admitting: Psychiatry

## 2016-01-08 ENCOUNTER — Ambulatory Visit (INDEPENDENT_AMBULATORY_CARE_PROVIDER_SITE_OTHER): Payer: Medicare HMO | Admitting: Neurology

## 2016-01-08 ENCOUNTER — Encounter: Payer: Self-pay | Admitting: Neurology

## 2016-01-08 VITALS — BP 130/82 | HR 72 | Ht 64.0 in | Wt 153.0 lb

## 2016-01-08 DIAGNOSIS — F0281 Dementia in other diseases classified elsewhere with behavioral disturbance: Secondary | ICD-10-CM

## 2016-01-08 DIAGNOSIS — G300 Alzheimer's disease with early onset: Secondary | ICD-10-CM | POA: Diagnosis not present

## 2016-01-08 DIAGNOSIS — F02818 Dementia in other diseases classified elsewhere, unspecified severity, with other behavioral disturbance: Secondary | ICD-10-CM | POA: Insufficient documentation

## 2016-01-08 MED ORDER — DONEPEZIL HCL 10 MG PO TABS: 10.0000 mg | ORAL_TABLET | Freq: Every day | ORAL | 5 refills | Status: DC

## 2016-01-08 MED ORDER — DONEPEZIL HCL 5 MG PO TABS: 5.0000 mg | ORAL_TABLET | Freq: Every day | ORAL | 0 refills | Status: DC

## 2016-01-08 NOTE — Patient Instructions (Signed)
Donepezil tablets What is this medicine? DONEPEZIL (doe NEP e zil) is used to treat mild to moderate dementia caused by Alzheimer's disease. This medicine may be used for other purposes; ask your health care provider or pharmacist if you have questions. What should I tell my health care provider before I take this medicine? They need to know if you have any of these conditions: -asthma or other lung disease -difficulty passing urine -head injury -heart disease -history of irregular heartbeat -liver disease -seizures (convulsions) -stomach or intestinal disease, ulcers or stomach bleeding -an unusual or allergic reaction to donepezil, other medicines, foods, dyes, or preservatives -pregnant or trying to get pregnant -breast-feeding How should I use this medicine? Take this medicine by mouth with a glass of water. Follow the directions on the prescription label. You may take this medicine with or without food. Take this medicine at regular intervals. This medicine is usually taken before bedtime. Do not take it more often than directed. Continue to take your medicine even if you feel better. Do not stop taking except on your doctor's advice. If you are taking the 23 mg donepezil tablet, swallow it whole; do not cut, crush, or chew it. Talk to your pediatrician regarding the use of this medicine in children. Special care may be needed. Overdosage: If you think you have taken too much of this medicine contact a poison control center or emergency room at once. NOTE: This medicine is only for you. Do not share this medicine with others. What if I miss a dose? If you miss a dose, take it as soon as you can. If it is almost time for your next dose, take only that dose, do not take double or extra doses. What may interact with this medicine? Do not take this medicine with any of the following medications: -certain medicines for fungal infections like itraconazole, fluconazole, posaconazole, and  voriconazole -cisapride -dextromethorphan; quinidine -dofetilide -dronedarone -pimozide -quinidine -thioridazine -ziprasidone This medicine may also interact with the following medications: -antihistamines for allergy, cough and cold -atropine -bethanechol -carbamazepine -certain medicines for bladder problems like oxybutynin, tolterodine -certain medicines for Parkinson's disease like benztropine, trihexyphenidyl -certain medicines for stomach problems like dicyclomine, hyoscyamine -certain medicines for travel sickness like scopolamine -dexamethasone -ipratropium -NSAIDs, medicines for pain and inflammation, like ibuprofen or naproxen -other medicines for Alzheimer's disease -other medicines that prolong the QT interval (cause an abnormal heart rhythm) -phenobarbital -phenytoin -rifampin, rifabutin or rifapentine This list may not describe all possible interactions. Give your health care provider a list of all the medicines, herbs, non-prescription drugs, or dietary supplements you use. Also tell them if you smoke, drink alcohol, or use illegal drugs. Some items may interact with your medicine. What should I watch for while using this medicine? Visit your doctor or health care professional for regular checks on your progress. Check with your doctor or health care professional if your symptoms do not get better or if they get worse. You may get drowsy or dizzy. Do not drive, use machinery, or do anything that needs mental alertness until you know how this drug affects you. What side effects may I notice from receiving this medicine? Side effects that you should report to your doctor or health care professional as soon as possible: -allergic reactions like skin rash, itching or hives, swelling of the face, lips, or tongue -changes in vision -feeling faint or lightheaded, falls -problems with balance -redness, blistering, peeling or loosening of the skin, including inside the  mouth -slow heartbeat,   or palpitations -stomach pain -unusual bleeding or bruising, red or purple spots on the skin -vomiting -weight loss Side effects that usually do not require medical attention (report to your doctor or health care professional if they continue or are bothersome): -diarrhea, especially when starting treatment -headache -indigestion or heartburn -loss of appetite -muscle cramps -nausea This list may not describe all possible side effects. Call your doctor for medical advice about side effects. You may report side effects to FDA at 1-800-FDA-1088. Where should I keep my medicine? Keep out of reach of children. Store at room temperature between 15 and 30 degrees C (59 and 86 degrees F). Throw away any unused medicine after the expiration date. NOTE: This sheet is a summary. It may not cover all possible information. If you have questions about this medicine, talk to your doctor, pharmacist, or health care provider.    2016, Elsevier/Gold Standard. (2013-10-19 07:51:52)  

## 2016-01-08 NOTE — Progress Notes (Signed)
[facility]   Provider:  Larey Seat, M D  Referring Provider: Katherina Mires, MD Primary Care Physician:  Angelina Pih, MD   Chief Complaint  Patient presents with  . Follow-up    memory    HPI:  [name] is a 70 y.o. female , who Is seen here as a referral from Dr. Jonny Ruiz, now Dr. Prince Rome , now looking for another PCP-  for subjective short termmemory loss, word finding  and hypersomnia.   Mrs. Willner herself introduced her symptoms as anxiety,  depression and decline in memory. She also had some weight loss associated and  she said that she is under a lot of stress.  She feels fatigued, she has tinnitus, changes in appetite disinterest in some activities sometimes, is confused but certainly feels forgetful, and she reports she has restless legs. The patient reports that she is certainly forgetful at times little confused. She has a normal by the same object 3 times in the supermarkets she has to keep lists at certain places to not forget sequences shopping etc. Sometimes she forgets with a left has been placed. Her husband has taken over all the financial paperwork, and she reports that he also keeps no doctor's appointments etc. on her calendar. He rolled over the last year. Her antidepressants were changed by her primary care physician after her old one retired, she had increased the Zoloft from 92 to 100 mg , but felt her memory was not improved. Her husband tries to help her organize desks and kitchen cabinets , but she feels now more than ever that she cannot find what she looks for. This upsets her greatly.   She has regular bed times, at about 11 PM , reading in bed, falling asleep within 30 minutes or less. Her husband noted her legs kicking and twitching. She has no restless legs, however.  The patient is not aware of her movements, she acts out dreams now, yelling in her sleep , thrashing.  She denies visuial or auditory hallucinations.  Her surgical  history she endorsed a left knee surgery hysterectomy an anterior cervical spine fusion a tonsillectomy and appendectomy. The patient lives with her husband (from [place] ) her children have gone on to their own homes,  she drinks about 3 caffeinated beverages a day . Her mother is alive at 70 ( [place] her father lived to a ripe age of 80.   She's currently on Ativan  ( started last week ) as needed to initiate sleep but also to help with anxiety attacks and she takes Zoloft 100 mg a day. The patient is appearing happy and conversant, but concerned. She reported crying spells before antidepressants were introduced 2001, she reported she had a nervous break down on 12-02-99.   Interval history : Patient brought her husband to today's appointment .  She had a normal EEG, and brain MRI .  We repeated her MOCA today, she scored  23 out of 30 points, lost 3 of the 5 recall words.  She still interchanges right and left, and her husband believes she has dyslexia. She displaces things around the house, including a Diamond ring in her house in the [place]  He described his wife as easily distracted and discouraged, and highly anxious.  I used the term mild cognitive impairment but would consider a trial of Aderall or Ritalin in this patient .  Would she be willing to see Dr. Casimiro Needle. I would like for her  to reduce the Zoloft to 50 mg daily. She is less sleepy on lesser Zoloft, sleeps 8 hours a day.   Interval history 10-11-14, [name] is here today for a repeat patient visits after 8 months. She has no bird last winter in [place] and has started taking a stimulant medication which she feels has helped her to concentrate and remained alert during the day. A reduction in Lexapro was also beneficial for her overall mood and alertness. Today be performed again a Montral cognitive assessment test and she scored 26 out of 30 points she was fully oriented to time and place she  recalled 3 out of 5 words on recall had no trouble with attention subtraction, association of language and obstruction. She named ALT 3 animals correctly and that the work fluency test score at 19 points. She did perfect on the clock drawing she was able to copy a treated three-dimensional image of acute. She failed the trail making test.  The patient presented with excessive daytime sleepiness reflected and an Epworth sleepiness score of 13 points.  There was a report of a dry mouth in the morning witnessed lo ud snoring and witnessed apneas by his spouse. She reports god apetite.  The patient's regular sleep routine is as follows; the patient normally goes to bed around 11 and falls asleep within 10-15 minutes he is awoken 3 times at night and nocturia and is woken by his alarm clock at 6 in the morning. 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  07-11-2015 Patient tearful, MOCA severely impaired, she is highly anxious.  Could not finish the trail making test. Soul not longer drive.   08-08-2015 [name] here today for follow-up. My original plan was to obtain a cognitive test, but [name] is quite upset and anxious today and in the state of agitation at test is likely less reliable than necessary. She has also with her husband being the caretaker of her elderly mother who has been visiting for the last month. Her mother can communicate to [name] and [place] but not to her husband. [name]. feels a little bit guilty, her mother is argumentative and aggressive, and she cannot longer tolerate it. Her mother is suprisingly sharp minded and healthy, but she causes Mrs.  down. We reviewed together the MRI scan, and I feel strongly that [name] emotional state prohibits a normal resolution of cognitive tests. I would like to refer her to a female psychiatrist but  Dr. Sheralyn Boatman is not in network. .  01-08-2016 , I had the pleasure of seeing Mr. and Mrs. Spratling  here today, both had an excellent holiday in [place] on cruise. We are meeting today to also reevaluate the cognitive status of [name]. She still has some difficulties with attention, distractibility and appears anxious than ever in the test situation. 22-30, but loss of the recall words and the serial 7.  Patient has seen Dr Adele Schilder, female psychiatrist.  Her mood changes and outbreaks have been frequent; impulse control is poor.  Starting Namenda.   MMSE - Mini Mental State Exam 01/08/2016  Orientation to time 5  Orientation to Place 5  Registration 3  Attention/ Calculation 0  Attention/Calculation-comments repeatedly had to redirect pt and remind of that she was subtracting 7  Recall 1  Language- name 2 objects 2  Language- repeat 0  Language- follow 3 step command 3  Language- read & follow direction 1  Write a sentence 1  Copy design 0  Total score 21   Montreal Cognitive Assessment  07/11/2015 02/06/2014  Visuospatial/ Executive (0/5) 3 4  Naming (0/3) 3 3  Attention: Read list of digits (0/2) 1 1  Attention: Read list of letters (0/1) 1 1  Attention: Serial 7 subtraction starting at 100 (0/3) 1 2  Language: Repeat phrase (0/2) 0 2  Language : Fluency (0/1) 1 1  Abstraction (0/2) 2 1  Delayed Recall (0/5) 0 2  Orientation (0/6) 6 6  Total 18 23  Adjusted Score (based on education) 18 -     The patient was tearful during the testing in April.   Review of Systems:  Out of a complete 14 system review, the patient complains of only the following symptoms, and all other reviewed systems are negative.  I cannot focus- I go to the kitchen and forgot why. More  evidence of word finding in a conversation, trouble with appointments. Trouble interpersonally with friend and mother.  More crying spells, very anxious.  She endorsed the geriatric depression score at 2 points , MMSE last visit 27/30 points.  MOCA was 18, last visit  Not longer MCI- but possible  early dementia,  possible pseudo dementia with major depression .  MMSE  21 -30.    Social History   Social History  . Marital status: Married    Spouse name: N/A  . Number of children: 2  . Years of education: hs   Occupational History  . Retired    Social History Main Topics  . Smoking status: Never Smoker  . Smokeless tobacco: Never Used  . Alcohol use No     Comment: occ (glass of wine)  . Drug use: No  . Sexual activity: Yes    Birth control/ protection: None   Other Topics Concern  . Not on file   Social History Narrative   Right handed.   Caffeine 3 cups daily.  Married, 2 kids.  College.  Retired.      Family History  Problem Relation Age of Onset  . Colon cancer Neg Hx     Past Medical History:  Diagnosis Date  . Anxiety   . Depression   . Tinnitus     Past Surgical History:  Procedure Laterality Date  . ABDOMINAL HYSTERECTOMY    . APPENDECTOMY    . CERVICAL SPINE SURGERY    . knee surgery Right   . MOUTH SURGERY     roof of mouth  . TONSILLECTOMY      Current Outpatient Prescriptions  Medication Sig Dispense Refill  . esomeprazole (NEXIUM) 40 MG capsule Take 1 capsule (40 mg total) by mouth daily at 12 noon. 90 capsule 3  . ranitidine (ZANTAC) 150 MG tablet      No current facility-administered medications for this visit.     Allergies as of 01/08/2016 - Review Complete 01/08/2016  Allergen Reaction Noted  . Ibuprofen Nausea And Vomiting 01/05/2014    Vitals: BP 130/82   Pulse 72   Ht 5\' 4"  (1.626 m)   Wt 153 lb (69.4 kg)   BMI 26.26 kg/m  Last Weight:  Wt Readings from Last 1 Encounters:  01/08/16 153 lb (69.4 kg)       Last Height:   Ht Readings from Last 1 Encounters:  01/08/16 5\' 4"  (1.626 m)    Physical exam:  General: The patient is awake, alert and appears not in acute distress. The patient is well  groomed. Head: Normocephalic, atraumatic. Neck is supple. Mallampati 2 She repo,  neck circumference: 13.5 inches . Nasal airflow  unrestricted  TMJ is not evident . Retrognathia is not seen.  Cardiovascular:  Regular rate and rhythm , without  murmurs or carotid bruit, and without distended neck veins. Respiratory: Lungs are clear to auscultation. Skin:  Without evidence of edema, or rash Trunk: BMI elevated , normal posture.  Neurologic exam : The patient is awake and alert, oriented to place and time.  She has a fear of people talking about her.  She is worried about a miscommunication, during her recent [place] stay. Feels cut off by formerly close friends, cannot stop worrying about this.  Memory subjective  described as impaired, inattentive, unfocussed.  There is a normal attention span & concentration ability. Speech is fluent with aphasia. In her interview she was looking for words.  Mood and affect are high strung , tearful, a little agitated. She seems depressed- believes she harmed someone due to being  disinhibited with comments. Cranial nerves: Pupils are equal and briskly reactive to light. Extraocular movements  in vertical and horizontal planes intact and without nystagmus. Visual fields by finger perimetry are intact.  Facial motor strength is symmetric and tongue and uvula move midline. Motor exam: Normal tone, muscle bulk and symmetric, strength in all extremities. Sensory:  Fine touch, pinprick and vibration were tested in all extremities.   Assessment:  After physical and neurologic examination, review of laboratory studies, imaging, neurophysiology testing and pre-existing records,  assessment is progression of COGNITIVE DYSFUNCTION and mental changes, loss of impulse control  . The patient was advised of the nature of the diagnosed disorder , the treatment options and risks for general a heNDalth and wellness arising from not treating the condition. She is short term memory impaired/  She is anxious, much less  than in our last visit. Visit duration was 30 minutes.  She has significant cognitive  decline, reflected in MMSE 21 -30.   Plan:  Treatment plan and additional workup : Continue melatonin.  Zoloft to 50  mg daily upon patient's request. I will add Aricept for this patient 5 mg a day po.  Start 10 mg Aricept after 30 days- if not tolerated consider Namenda.    Asencion Partridge Kiara Mcdowell MD  01/08/2016    Cc Dr.  Adele Schilder

## 2016-01-21 ENCOUNTER — Encounter (HOSPITAL_COMMUNITY): Payer: Self-pay | Admitting: Psychiatry

## 2016-01-21 ENCOUNTER — Ambulatory Visit (INDEPENDENT_AMBULATORY_CARE_PROVIDER_SITE_OTHER): Payer: Medicare HMO | Admitting: Psychiatry

## 2016-01-21 VITALS — BP 118/72 | HR 81 | Ht 61.0 in | Wt 151.0 lb

## 2016-01-21 DIAGNOSIS — Z79899 Other long term (current) drug therapy: Secondary | ICD-10-CM

## 2016-01-21 DIAGNOSIS — F411 Generalized anxiety disorder: Secondary | ICD-10-CM | POA: Diagnosis not present

## 2016-01-21 NOTE — Progress Notes (Signed)
[facility] [phone] Progress Note  Angles Arciniega [id number] 70 y.o.  01/21/2016 11:35 AM  Chief Complaint:  I am feeling better.    History of Present Illness:  Halcyon is a 70 year old [place] female who was seen first time in June due to depressive symptoms.  She was given Lamictal and recommended to see therapist who suggested intensive outpatient program.  She completed intensive outpatient program in July.  She developed side effects from Lamictal which includes memory issues and she decided to stop.  Recently she see neurologist and found to have decline in her memory significantly and recommended Aricept.  She is taking Aricept 10 mg and seen improvement in her memory but she still have struggled in attention, concentration, forgetfulness.  Her neurologist also recommended Zoloft but patient does not want to try any antidepressant.  She feels her depression is much stable.  Her mother who lives in [place] doing very well.  She has good help around.  She denies any panic attack, she denies any crying spells and her sleep is improved.  She feel her interest and motivation is coming back.  She is more social and active.  Patient denies any crying spells, feeling of hopelessness or worthlessness.  She does not want to take any antidepressant at this time.  She is going to [place] to spend winter time.  Patient denies any paranoia, hallucination, mania.  She waited about In her memory but she is happy that her husband is very supportive.  She like to continue follow-up with neurologist when she come back from [place].  She also wants to continue therapy once she returned from [place]. Her appetite is fair.  Her vital signs are stable.    Suicidal Ideation: No Plan Formed: No Patient has means to carry out plan: No  Homicidal Ideation: No Plan Formed: No Patient has means to carry out plan: No  Past Psychiatric History/Hospitalization(s): Patient reported  history of depression after 911.  She was living in [place] and at that time she became very depressed, emotional and her primary care physician started her on antidepressant.  She has taken Wellbutrin, Prozac and recently Zoloft.  She also taken Ativan, Xanax and recently Ritalin to help her attention but stopped taking all his medication because of side effects or it did not improve.  We tried Lamictal but patient complained of memory issues and it was stopped.  Patient denies any psychiatric inpatient treatment, suicidal attempt, psychosis, self abusive behavior, PTSD or any OCD symptoms. Anxiety: Yes Bipolar Disorder: No Depression: Yes Mania: No Psychosis: No Schizophrenia: No Personality Disorder: No Hospitalization for psychiatric illness: No History of Electroconvulsive Shock Therapy: No Prior Suicide Attempts: No  Family History; Patient denies any family history of psychiatric illness.  Medical History; Her primary care physician is [facility] Maudie [facility]  She has GERD, tinnitus and chronic neck pain.  She was also seen by Dr. Roddie Mc for mild cognitive impairment.  Psychosocial History; Patient born in [place] and grew up there.  She's been living in [place] for more than 40 years.  Initially she was living in [place] and then due to her husband's job she moved to [place] and then in 1996 she moved to [place].  Patient has 2 sons who lives out of town.  One of her son is Administrator, Civil Service and work in [place].  Patient lives with her husband who is very supportive.  Her 71 year old mother living [place]  and comes on and off in [place].  Patient is an only child from her parent.  Her father is deceased.  Review of Systems: Psychiatric: Agitation: No Hallucination: No Depressed Mood: No Insomnia: No Hypersomnia: No Altered Concentration: Yes Feels Worthless: No Grandiose Ideas: No Belief In Special Powers: No New/Increased Substance  Abuse: No Compulsions: No  Neurologic: Headache: No Seizure: No Paresthesias: No   Outpatient Encounter Prescriptions as of 01/21/2016  Medication Sig  . donepezil (ARICEPT) 10 MG tablet Take 1 tablet (10 mg total) by mouth at bedtime.  Marland Kitchen esomeprazole (NEXIUM) 40 MG capsule Take 1 capsule (40 mg total) by mouth daily at 12 noon.  . ranitidine (ZANTAC) 150 MG tablet   . donepezil (ARICEPT) 5 MG tablet Take 1 tablet (5 mg total) by mouth at bedtime.   No facility-administered encounter medications on file as of 01/21/2016.     No results found for this or any previous visit (from the past 2160 hour(s)).    Constitutional:  BP 118/72 (BP Location: Left Arm, Patient Position: Sitting, Cuff Size: Normal)   Pulse 81   Ht 5\' 1"  (1.549 m)   Wt 151 lb (68.5 kg)   BMI 28.53 kg/m    Musculoskeletal: Strength & Muscle Tone: within normal limits Gait & Station: normal Patient leans: N/A  Psychiatric Specialty Exam: General Appearance: Casual  Eye Contact::  Fair  Speech:  Slow  Volume:  Decreased  Mood:  Anxious  Affect:  Labile  Thought Process:  Descriptions of Associations: Circumstantial  Orientation:  Full (Time, Place, and Person)  Thought Content:  Rumination  Suicidal Thoughts:  No  Homicidal Thoughts:  No  Memory:  Immediate;   Fair Recent;   Fair Remote;   Fair  Judgement:  Fair  Insight:  Fair  Psychomotor Activity:  Normal  Concentration:  Fair  Recall:  [employer] of Knowledge:  Fair  Language:  Fair  Akathisia:  No  Handed:  Right  AIMS (if indicated):     Assets:  Communication Skills Desire for Improvement Financial Resources/Insurance Housing  ADL's:  Intact  Cognition:  Impaired,  Mild  Sleep:        Established Problem, Stable/Improving (1), Review of Psycho-Social Stressors (1), Review and summation of old records (2), Review of Last Therapy Session (1), Review of Medication Regimen & Side Effects (2) and Review of New Medication or  Change in Dosage (2)  Assessment: Axis I: Major depressive disorder, recurrent moderate.  Anxiety disorder NOS  Axis II: Deferred  Axis III:  Past Medical History:  Diagnosis Date  . Anxiety   . Depression   . Tinnitus      Plan:  I reviewed records from intensive outpatient program, her neurologist and her current medication.  She does not want to try any other antidepressant.  Discussed psychosocial stressors.  She feels her depression is much stable and at this time she deferred taking any antidepressant.  She like to resume her counseling once she returned from [place] in April.  Discuss signs of relapse in detail.  Recommended to call us back if she feels worsening of the symptom.  At this time we will not start any antidepressant.  Discontinue Lamictal.  Follow-up in 6 months. Discuss safety plan that anytime having active suicidal thoughts or homicidal thoughts and she need to call 911 or go to the local emergency room.   Valory Wetherby T., MD 01/21/2016        Patient ID: Jovita Gamma, female  DOB: 1945/03/26, 70 y.o.   MRN: [id number]

## 2016-04-30 ENCOUNTER — Telehealth: Payer: Self-pay | Admitting: Neurology

## 2016-04-30 NOTE — Telephone Encounter (Signed)
Patient's husband calling stating patient stays very tired all the time taking donepezil (ARICEPT) 10 MG tablet and cannot sleep.

## 2016-04-30 NOTE — Telephone Encounter (Signed)
I spoke to husband, he is very addiment that they symptoms started with the mediation. I even encouraged them to see PCP. Husband asks if it is worth taking the medication. He would like to discuss benefits vs side effects.

## 2016-04-30 NOTE — Telephone Encounter (Signed)
[name], please explain that aricept doesn't cause daytime sleepiness. She may have vivid dreams on this medication, but not insomnia.  There is something else going on.  Make RV . CD

## 2016-05-01 NOTE — Telephone Encounter (Signed)
Left VM and asked to change intake time to AM.

## 2016-05-21 ENCOUNTER — Telehealth: Payer: Self-pay | Admitting: Family Medicine

## 2016-05-21 NOTE — Telephone Encounter (Signed)
Husband states that the Donepezil is not working for the patient and would like to stop the medication.  She is currently on 10mg , how should she stop it?

## 2016-05-21 NOTE — Telephone Encounter (Signed)
Pt husband says medication not working and wants to know how to stop taking - does it need to be gradually or full stop.  [phone]

## 2016-05-22 NOTE — Telephone Encounter (Signed)
She can just stop aricept, there is no weaning process. CD

## 2016-05-25 NOTE — Telephone Encounter (Signed)
LM with recommendations below. Left call back number for further questions.

## 2016-07-20 ENCOUNTER — Ambulatory Visit: Payer: Medicare HMO | Admitting: Adult Health

## 2016-07-21 ENCOUNTER — Ambulatory Visit (HOSPITAL_COMMUNITY): Payer: Self-pay | Admitting: Psychiatry

## 2016-07-29 ENCOUNTER — Encounter: Payer: Self-pay | Admitting: Gastroenterology

## 2016-07-29 ENCOUNTER — Ambulatory Visit (INDEPENDENT_AMBULATORY_CARE_PROVIDER_SITE_OTHER): Payer: Medicare HMO | Admitting: Gastroenterology

## 2016-07-29 ENCOUNTER — Encounter (INDEPENDENT_AMBULATORY_CARE_PROVIDER_SITE_OTHER): Payer: Self-pay

## 2016-07-29 VITALS — BP 90/60 | HR 80 | Ht 61.75 in | Wt 151.5 lb

## 2016-07-29 DIAGNOSIS — K219 Gastro-esophageal reflux disease without esophagitis: Secondary | ICD-10-CM | POA: Diagnosis not present

## 2016-07-29 MED ORDER — OMEPRAZOLE 40 MG PO CPDR: 40.0000 mg | DELAYED_RELEASE_CAPSULE | Freq: Every day | ORAL | 3 refills | Status: DC

## 2016-07-29 NOTE — Progress Notes (Signed)
HPI :  71 y/o female here for a follow up visit. She was seen here about a year ago for GERD and abdominal pain. She had previously tested positive for H pylori and then had resolution of her pain, eradication testing for H pylori was negative.   For her pyrosis she was tried on zantac after discussing options. Trial of zantac 150mg  did help but she had some frequent breatkthough and then transitioned to nexium. Nexium worked pretty well for her symptoms without much breakthrough. She ran out of it and went back to taking zantac 150mg  BID. She has breakthrough symptoms onthis regimen, using TUMS frequently on most days of the week due to symptoms. Pyrosis is her main symptom at this point, worse with spicey foods. No dysphagia. No nausea or vomiting. Weight is stable.  No FH of esophagus cancer.   No prior upper endoscopy.   Colonoscopy 08/2012 per Dr. Amedeo Plenty [facility], was normal   Past Medical History:  Diagnosis Date  . Anxiety   . Depression   . GERD (gastroesophageal reflux disease)   . Tinnitus      Past Surgical History:  Procedure Laterality Date  . ABDOMINAL HYSTERECTOMY    . APPENDECTOMY    . CERVICAL SPINE SURGERY    . knee surgery Right   . MOUTH SURGERY     roof of mouth  . TONSILLECTOMY     Family History  Problem Relation Age of Onset  . Colon cancer Neg Hx    Social History  Substance Use Topics  . Smoking status: Never Smoker  . Smokeless tobacco: Never Used  . Alcohol use No     Comment: occ (glass of wine)   Current Outpatient Prescriptions  Medication Sig Dispense Refill  . ranitidine (ZANTAC) 150 MG tablet     . omeprazole (PRILOSEC) 40 MG capsule Take 1 capsule (40 mg total) by mouth daily. 90 capsule 3   No current facility-administered medications for this visit.    Allergies  Allergen Reactions  . Ibuprofen Nausea And Vomiting     Review of Systems: All systems reviewed and negative except where noted in HPI.   Lab Results  Component  Value Date   WBC 4.8 08/09/2015   HGB 13.6 08/09/2015   HCT 41.4 08/09/2015   MCV 81.9 08/09/2015   PLT 361.0 08/09/2015    Lab Results  Component Value Date   CREATININE 0.79 01/05/2014   BUN 14 01/05/2014   NA 137 01/05/2014   K 5.1 01/05/2014   CL 100 01/05/2014   CO2 28 01/05/2014    Lab Results  Component Value Date   ALT 24 01/05/2014   AST 22 01/05/2014   ALKPHOS 74 01/05/2014   BILITOT 0.2 01/05/2014     Physical Exam: BP 90/60 (BP Location: Left Arm, Patient Position: Sitting, Cuff Size: Normal)   Pulse 80   Ht 5' 1.75" (1.568 m) Comment: height measured without shoes  Wt 151 lb 8 oz (68.7 kg)   BMI 27.93 kg/m  Constitutional: Pleasant,well-developed, female in no acute distress. HEENT: Normocephalic and atraumatic. Conjunctivae are normal. No scleral icterus. Neck supple.  Cardiovascular: Normal rate, regular rhythm.  Pulmonary/chest: Effort normal and breath sounds normal. No wheezing, rales or rhonchi. Abdominal: Soft, nondistended, nontender. . There are no masses palpable. No hepatomegaly. Extremities: no edema Lymphadenopathy: No cervical adenopathy noted. Neurological: Alert and oriented to person place and time. Skin: Skin is warm and dry. No rashes noted. Psychiatric: Normal mood and  affect. Behavior is normal.   ASSESSMENT AND PLAN: 71 year old female here for reassessment for reflux symptoms.  Long-standing symptoms, at the last visit we discussed options to try to transition her to Zantac given potential risk of long-term PPIs. This has not worked well for her and she is at fairly frequent breakthrough on Zantac monotherapy. She did well on trial of Nexium previously, recommend transitioning back to PPI at this time. Discussed options will transition her to omeprazole due to cost, we'll place initially on 40 mg once daily to control symptoms, titrate to low-dose daily dose needed to control symptoms. Otherwise given her long-standing symptoms  which are quite bothersome to her present, I offered upper endoscopy given she's never had one. Following discussion risks and benefits she wanted to proceed, further recommendations pending the result.  [place] Cellar, MD [facility] Pager [phone]

## 2016-07-29 NOTE — Patient Instructions (Signed)
If you are age 69 or older, your body mass index should be between 23-30. Your Body mass index is 27.93 kg/m. If this is out of the aforementioned range listed, please consider follow up with your Primary Care Provider.  If you are age 52 or younger, your body mass index should be between 19-25. Your Body mass index is 27.93 kg/m. If this is out of the aformentioned range listed, please consider follow up with your Primary Care Provider.   We have sent the following medications to your pharmacy for you to pick up at your convenience:  Omeprazole  You have been scheduled for an endoscopy. Please follow written instructions given to you at your visit today. If you use inhalers (even only as needed), please bring them with you on the day of your procedure. Your physician has requested that you go to www.startemmi.com and enter the access code given to you at your visit today. This web site gives a general overview about your procedure. However, you should still follow specific instructions given to you by our office regarding your preparation for the procedure.  Thank you.

## 2016-07-30 ENCOUNTER — Ambulatory Visit (AMBULATORY_SURGERY_CENTER): Payer: Medicare HMO | Admitting: Gastroenterology

## 2016-07-30 ENCOUNTER — Encounter: Payer: Self-pay | Admitting: Gastroenterology

## 2016-07-30 VITALS — BP 106/67 | HR 71 | Temp 97.5°F | Resp 22 | Ht 61.75 in | Wt 151.0 lb

## 2016-07-30 DIAGNOSIS — K219 Gastro-esophageal reflux disease without esophagitis: Secondary | ICD-10-CM

## 2016-07-30 DIAGNOSIS — K317 Polyp of stomach and duodenum: Secondary | ICD-10-CM

## 2016-07-30 DIAGNOSIS — K210 Gastro-esophageal reflux disease with esophagitis: Secondary | ICD-10-CM

## 2016-07-30 MED ORDER — SODIUM CHLORIDE 0.9 % IV SOLN: 500.0000 mL | INTRAVENOUS | Status: DC

## 2016-07-30 NOTE — Progress Notes (Signed)
To PACU, vss patent aw report to rn 

## 2016-07-30 NOTE — Patient Instructions (Signed)
YOU HAD AN ENDOSCOPIC PROCEDURE TODAY AT THE [facility]:   Refer to the procedure report that was given to you for any specific questions about what was found during the examination.  If the procedure report does not answer your questions, please call your gastroenterologist to clarify.  If you requested that your care partner not be given the details of your procedure findings, then the procedure report has been included in a sealed envelope for you to review at your convenience later.  YOU SHOULD EXPECT: Some feelings of bloating in the abdomen. Passage of more gas than usual.  Walking can help get rid of the air that was put into your GI tract during the procedure and reduce the bloating. If you had a lower endoscopy (such as a colonoscopy or flexible sigmoidoscopy) you may notice spotting of blood in your stool or on the toilet paper. If you underwent a bowel prep for your procedure, you may not have a normal bowel movement for a few days.  Please Note:  You might notice some irritation and congestion in your nose or some drainage.  This is from the oxygen used during your procedure.  There is no need for concern and it should clear up in a day or so.  SYMPTOMS TO REPORT IMMEDIATELY:    Following upper endoscopy (EGD)  Vomiting of blood or coffee ground material  New chest pain or pain under the shoulder blades  Painful or persistently difficult swallowing  New shortness of breath  Fever of 100F or higher  Black, tarry-looking stools  For urgent or emergent issues, a gastroenterologist can be reached at any hour by calling [phone].    DIET:  We do recommend a small meal at first, but then you may proceed to your regular diet.  Drink plenty of fluids but you should avoid alcoholic beverages for 24 hours.  ACTIVITY:  You should plan to take it easy for the rest of today and you should NOT DRIVE or use heavy machinery until tomorrow (because of the sedation medicines  used during the test).    FOLLOW UP: Our staff will call the number listed on your records the next business day following your procedure to check on you and address any questions or concerns that you may have regarding the information given to you following your procedure. If we do not reach you, we will leave a message.  However, if you are feeling well and you are not experiencing any problems, there is no need to return our call.  We will assume that you have returned to your regular daily activities without incident.  If any biopsies were taken you will be contacted by phone or by letter within the next 1-3 weeks.  Please call us at [phone] if you have not heard about the biopsies in 3 weeks.    SIGNATURES/CONFIDENTIALITY: You and/or your care partner have signed paperwork which will be entered into your electronic medical record.  These signatures attest to the fact that that the information above on your After Visit Summary has been reviewed and is understood.  Full responsibility of the confidentiality of this discharge information lies with you and/or your care-partner.  Thank you for letting us take care of your healthcare needs today. 

## 2016-07-30 NOTE — Progress Notes (Signed)
Called to room to assist during endoscopic procedure.  Patient ID and intended procedure confirmed with present staff. Received instructions for my participation in the procedure from the performing physician.  

## 2016-07-30 NOTE — Progress Notes (Signed)
Pt's states no medical or surgical changes since previsit or office visit.Pt's states no medical or surgical changes since previsit or office visit. 

## 2016-07-30 NOTE — Op Note (Signed)
[facility] Patient Name: [name] Procedure Date: 07/30/2016 1:21 PM MRN: [id number] Endoscopist: Remo Lipps P. Makenlee Mckeag MD, MD Age: 71 Referring MD:  Date of Birth: 1945-08-11 Gender: Female Account #: [id number] Procedure:                Upper GI endoscopy Indications:              Heartburn - persistent despite Zantac, just                            recently transitioned to omeprazole Medicines:                Monitored Anesthesia Care Procedure:                Pre-Anesthesia Assessment:                           - Prior to the procedure, a History and Physical                            was performed, and patient medications and                            allergies were reviewed. The patient's tolerance of                            previous anesthesia was also reviewed. The risks                            and benefits of the procedure and the sedation                            options and risks were discussed with the patient.                            All questions were answered, and informed consent                            was obtained. Prior Anticoagulants: The patient has                            taken no previous anticoagulant or antiplatelet                            agents. ASA Grade Assessment: II - A patient with                            mild systemic disease. After reviewing the risks                            and benefits, the patient was deemed in                            satisfactory condition to undergo the procedure.  After obtaining informed consent, the endoscope was                            passed under direct vision. Throughout the                            procedure, the patient's blood pressure, pulse, and                            oxygen saturations were monitored continuously. The                            Endoscope was introduced through the mouth, and                            advanced to the  second part of duodenum. The upper                            GI endoscopy was accomplished without difficulty.                            The patient tolerated the procedure well. Scope In: Scope Out: Findings:                 Esophagogastric landmarks were identified: the                            Z-line was found at 29 cm, the gastroesophageal                            junction was found at 29 cm and the upper extent of                            the gastric folds was found at 34 cm from the                            incisors.                           A 5 cm hiatal hernia was present.                           The Z-line showed evidence of mild esophagitis                            (grade A) with what appeared to be most likely an                            inflammatory nodule a few mm in size at the 6                            o'clock position. Biopsies were taken with a cold  forceps for histology.                           The exam of the esophagus was otherwise normal.                           A single 4 mm sessile polyp was found in the                            gastric antrum. Biopsies were taken with a cold                            forceps for histology.                           The exam of the stomach was otherwise normal.                           The duodenal bulb and second portion of the                            duodenum were normal. Complications:            No immediate complications. Estimated blood loss:                            Minimal. Estimated Blood Loss:     Estimated blood loss was minimal. Impression:               - Esophagogastric landmarks identified.                           - 5 cm hiatal hernia.                           - Mild esophagitis                           - Z-line with suspected inflammatory nodule.                            Biopsied.                           - A single gastric polyp. Biopsied.                            - Normal duodenal bulb and second portion of the                            duodenum. Recommendation:           - Patient has a contact number available for                            emergencies. The signs and symptoms of potential  delayed complications were discussed with the                            patient. Return to normal activities tomorrow.                            Written discharge instructions were provided to the                            patient.                           - Resume previous diet.                           - Continue present medications (omeprazole, just                            resumed yesterday)                           - Await pathology results. Remo Lipps P. Nesta Scaturro MD, MD 07/30/2016 1:40:56 PM This report has been signed electronically.

## 2016-07-31 ENCOUNTER — Telehealth: Payer: Self-pay | Admitting: *Deleted

## 2016-07-31 NOTE — Telephone Encounter (Signed)
  Follow up Call-  Call back number 07/30/2016  Post procedure Call Back phone  # [phone]  Permission to leave phone message Yes  Some recent data might be hidden     Patient questions:  Do you have a fever, pain , or abdominal swelling? No. Pain Score  0 *  Have you tolerated food without any problems? Yes.    Have you been able to return to your normal activities? Yes.    Do you have any questions about your discharge instructions: Diet   No Medications  No. Follow up visit  No.  Do you have questions or concerns about your Care? No.  Actions: * If pain score is 4 or above: No action needed, pain <4.

## 2016-08-05 ENCOUNTER — Encounter: Payer: Self-pay | Admitting: Gastroenterology

## 2016-08-07 ENCOUNTER — Ambulatory Visit: Payer: Self-pay | Admitting: Gastroenterology

## 2016-09-16 ENCOUNTER — Ambulatory Visit: Payer: Medicare HMO | Admitting: Adult Health

## 2016-09-17 ENCOUNTER — Encounter: Payer: Self-pay | Admitting: Adult Health

## 2016-10-27 ENCOUNTER — Other Ambulatory Visit: Payer: Self-pay | Admitting: Family Medicine

## 2016-10-27 DIAGNOSIS — Z1231 Encounter for screening mammogram for malignant neoplasm of breast: Secondary | ICD-10-CM

## 2016-12-02 ENCOUNTER — Telehealth: Payer: Self-pay | Admitting: Neurology

## 2016-12-02 NOTE — Telephone Encounter (Signed)
Pt had a Script that was here in our office that was printed on Apr 29 2015. Pt didn't pick it up so at this point we will shred the script

## 2016-12-09 ENCOUNTER — Ambulatory Visit
Admission: RE | Admit: 2016-12-09 | Discharge: 2016-12-09 | Disposition: A | Discharge status: Home / Self Care | Payer: Medicare HMO | Source: Ambulatory Visit | Attending: Family Medicine | Admitting: Family Medicine

## 2016-12-09 DIAGNOSIS — Z1231 Encounter for screening mammogram for malignant neoplasm of breast: Secondary | ICD-10-CM

## 2016-12-11 ENCOUNTER — Other Ambulatory Visit: Payer: Self-pay | Admitting: Gastroenterology

## 2016-12-23 ENCOUNTER — Encounter: Payer: Self-pay | Admitting: Adult Health

## 2016-12-23 ENCOUNTER — Ambulatory Visit (INDEPENDENT_AMBULATORY_CARE_PROVIDER_SITE_OTHER): Payer: Medicare HMO | Admitting: Adult Health

## 2016-12-23 VITALS — BP 119/69 | HR 69 | Ht 62.0 in | Wt 148.2 lb

## 2016-12-23 DIAGNOSIS — R413 Other amnesia: Secondary | ICD-10-CM

## 2016-12-23 MED ORDER — MEMANTINE HCL 5 MG PO TABS: 5.0000 mg | ORAL_TABLET | Freq: Two times a day (BID) | ORAL | 5 refills | Status: DC

## 2016-12-23 NOTE — Progress Notes (Signed)
PATIENT: [name] DOB: October 23, 1945  REASON FOR VISIT: follow up- memory HISTORY FROM: patient  HISTORY OF PRESENT ILLNESS: Today 12/23/16 Mr. [name] is a 71 year old female with a history of memory disturbance. She returns today for follow-up. The patient feels that her memory has remained stable. She is able to complete all ADLs independently. She lives at home with her husband. He knows that she does have trouble with short-term memory. He completes all the finances and has always done this. He reports that he does have to help her with organization. He does remind her of appointments. The patient manages her own medications however her husband oversee this. The patient states that she was on Aricept however it caused her many side effects including not being able to sleep at night. She no longer takes this medication. She returns today for an evaluation.  HISTORY 01/08/16 Copied from Dr. Edwena Felty notes: 01-08-2016 , I had the pleasure of seeing [name] here today, both had an excellent holiday in [place] on cruise. We are meeting today to also reevaluate the cognitive status of Mrs. [name]. She still has some difficulties with attention, distractibility and appears anxious than ever in the test situation. 22-30, but loss of the recall words and the serial 7.  Patient has seen Dr Adele Schilder, female psychiatrist.  Her mood changes and outbreaks have been frequent; impulse control is poor.  Starting Namenda.  REVIEW OF SYSTEMS: Out of a complete 14 system review of symptoms, the patient complains only of the following symptoms, and all other reviewed systems are negative.  Ringing in ears, decreased concentration, depression, nervous/anxious, memory loss  ALLERGIES: Allergies  Allergen Reactions  . Ibuprofen Nausea And Vomiting    HOME MEDICATIONS: Outpatient Medications Prior to Visit  Medication Sig Dispense Refill  . omeprazole (PRILOSEC) 40 MG capsule Take 1 capsule (40  mg total) by mouth daily. 90 capsule 3  . ranitidine (ZANTAC) 150 MG tablet TAKE ONE TABLET BY MOUTH TWICE DAILY 60 tablet 6  . ranitidine (ZANTAC) 150 MG tablet      Facility-Administered Medications Prior to Visit  Medication Dose Route Frequency Provider Last Rate Last Dose  . 0.9 %  sodium chloride infusion  500 mL Intravenous Continuous Armbruster, Carlota Raspberry, MD        PAST MEDICAL HISTORY: Past Medical History:  Diagnosis Date  . Anxiety   . Depression   . GERD (gastroesophageal reflux disease)   . Tinnitus     PAST SURGICAL HISTORY: Past Surgical History:  Procedure Laterality Date  . ABDOMINAL HYSTERECTOMY    . APPENDECTOMY    . CERVICAL SPINE SURGERY    . knee surgery Right   . MOUTH SURGERY     roof of mouth  . TONSILLECTOMY    . WISDOM TOOTH EXTRACTION      FAMILY HISTORY: Family History  Problem Relation Age of Onset  . Colon cancer Neg Hx     SOCIAL HISTORY: Social History   Social History  . Marital status: Married    Spouse name: N/A  . Number of children: 2  . Years of education: hs   Occupational History  . Retired    Social History Main Topics  . Smoking status: Never Smoker  . Smokeless tobacco: Never Used  . Alcohol use No     Comment: occ (glass of wine)  . Drug use: No  . Sexual activity: Yes    Birth control/ protection: None   Other Topics  Concern  . Not on file   Social History Narrative   Right handed.   Caffeine 3 cups daily.  Married, 2 kids.  College.  Retired.        PHYSICAL EXAM  Vitals:   12/23/16 1353  BP: 119/69  Pulse: 69  Weight: 148 lb 3.2 oz (67.2 kg)  Height: 5\' 2"  (1.575 m)   Body mass index is 27.11 kg/m.   MMSE - Mini Mental State Exam 12/23/2016 01/08/2016  Orientation to time 4 5  Orientation to Place 5 5  Registration 3 3  Attention/ Calculation 1 0  Attention/Calculation-comments - repeatedly had to redirect pt and remind of that she was subtracting 7  Recall 0 1  Language- name 2  objects 2 2  Language- repeat 0 0  Language- follow 3 step command 3 3  Language- read & follow direction 1 1  Write a sentence 1 1  Copy design 1 0  Total score 21 21     Generalized: Well developed, in no acute distress   Neurological examination  Mentation: Alert oriented to time, place, history taking. Follows all commands speech and language fluent Cranial nerve II-XII: Pupils were equal round reactive to light. Extraocular movements were full, visual field were full on confrontational test. Facial sensation and strength were normal. Uvula tongue midline. Head turning and shoulder shrug  were normal and symmetric. Motor: The motor testing reveals 5 over 5 strength of all 4 extremities. Good symmetric motor tone is noted throughout.  Sensory: Sensory testing is intact to soft touch on all 4 extremities. No evidence of extinction is noted.  Coordination: Cerebellar testing reveals good finger-nose-finger and heel-to-shin bilaterally.  Gait and station: Gait is normal. Tandem gait is normal. Romberg is negative. No drift is seen.  Reflexes: Deep tendon reflexes are symmetric and normal bilaterally.   DIAGNOSTIC DATA (LABS, IMAGING, TESTING) - I reviewed patient records, labs, notes, testing and imaging myself where available.  Lab Results  Component Value Date   WBC 4.8 08/09/2015   HGB 13.6 08/09/2015   HCT 41.4 08/09/2015   MCV 81.9 08/09/2015   PLT 361.0 08/09/2015      Component Value Date/Time   NA 137 01/05/2014 1303   K 5.1 01/05/2014 1303   CL 100 01/05/2014 1303   CO2 28 01/05/2014 1303   GLUCOSE 95 01/05/2014 1303   GLUCOSE 80 09/17/2006 1233   BUN 14 01/05/2014 1303   CREATININE 0.79 01/05/2014 1303   CALCIUM 10.3 01/05/2014 1303   PROT 7.3 01/05/2014 1303   ALBUMIN 4.7 01/05/2014 1303   AST 22 01/05/2014 1303   ALT 24 01/05/2014 1303   ALKPHOS 74 01/05/2014 1303   BILITOT 0.2 01/05/2014 1303   GFRNONAA 78 01/05/2014 1303   GFRAA 90 01/05/2014 1303       ASSESSMENT AND PLAN 71 y.o. year old female  has a past medical history of Anxiety; Depression; GERD (gastroesophageal reflux disease); and Tinnitus. here with:  1. Memory disturbance  She is score has remained stable. We will continue to monitor the memory. She is interested in starting Namenda. She will start low-dose 5 mg twice a day. I have reviewed side effects with the patient and provided him with a handout. She is also interested in research trial and will talk to our research department today. She is advised that if her symptoms worsen or she develops new symptoms she should let us know. She will follow-up in 6 months or sooner if needed.  Ward Givens, MSN, NP-C 12/23/2016, 2:11 PM [facility] [address], [place], [place] [address] [phone]

## 2016-12-23 NOTE — Patient Instructions (Addendum)
Your Plan: Memory score is stable Start Namenda 5 mg twice a day If tolerating call in 30 days for increase in medication If your symptoms worsen or you develop new symptoms please let us know.    Thank you for coming to see [place] at [facility]. I hope we have been able to provide you high quality care today.  You may receive a patient satisfaction survey over the next few weeks. We would appreciate your feedback and comments so that we may continue to improve ourselves and the health of our patients.   Memantine Tablets What is this medicine? MEMANTINE (MEM an teen) is used to treat dementia caused by Alzheimer's disease. This medicine may be used for other purposes; ask your health care provider or pharmacist if you have questions. COMMON BRAND NAME(S): Namenda What should I tell my health care provider before I take this medicine? They need to know if you have any of these conditions: -difficulty passing urine -kidney disease -liver disease -seizures -an unusual or allergic reaction to memantine, other medicines, foods, dyes, or preservatives -pregnant or trying to get pregnant -breast-feeding How should I use this medicine? Take this medicine by mouth with a glass of water. Follow the directions on the prescription label. You may take this medicine with or without food. Take your doses at regular intervals. Do not take your medicine more often than directed. Continue to take your medicine even if you feel better. Do not stop taking except on the advice of your doctor or health care professional. Talk to your pediatrician regarding the use of this medicine in children. Special care may be needed. Overdosage: If you think you have taken too much of this medicine contact a poison control center or emergency room at once. NOTE: This medicine is only for you. Do not share this medicine with others. What if I miss a dose? If you miss a dose, take it as soon as you can.  If it is almost time for your next dose, take only that dose. Do not take double or extra doses. If you do not take your medicine for several days, contact your health care provider. Your dose may need to be changed. What may interact with this medicine? -acetazolamide -amantadine -cimetidine -dextromethorphan -dofetilide -hydrochlorothiazide -ketamine -metformin -methazolamide -quinidine -ranitidine -sodium bicarbonate -triamterene This list may not describe all possible interactions. Give your health care provider a list of all the medicines, herbs, non-prescription drugs, or dietary supplements you use. Also tell them if you smoke, drink alcohol, or use illegal drugs. Some items may interact with your medicine. What should I watch for while using this medicine? Visit your doctor or health care professional for regular checks on your progress. Check with your doctor or health care professional if there is no improvement in your symptoms or if they get worse. You may get drowsy or dizzy. Do not drive, use machinery, or do anything that needs mental alertness until you know how this drug affects you. Do not stand or sit up quickly, especially if you are an older patient. This reduces the risk of dizzy or fainting spells. Alcohol can make you more drowsy and dizzy. Avoid alcoholic drinks. What side effects may I notice from receiving this medicine? Side effects that you should report to your doctor or health care professional as soon as possible: -allergic reactions like skin rash, itching or hives, swelling of the face, lips, or tongue -agitation or a feeling of restlessness -depressed mood -dizziness -hallucinations -  redness, blistering, peeling or loosening of the skin, including inside the mouth -seizures -vomiting Side effects that usually do not require medical attention (report to your doctor or health care professional if they continue or are  bothersome): -constipation -diarrhea -headache -nausea -trouble sleeping This list may not describe all possible side effects. Call your doctor for medical advice about side effects. You may report side effects to FDA at 1-800-FDA-1088. Where should I keep my medicine? Keep out of the reach of children. Store at room temperature between 15 degrees and 30 degrees C (59 degrees and 86 degrees F). Throw away any unused medicine after the expiration date. NOTE: This sheet is a summary. It may not cover all possible information. If you have questions about this medicine, talk to your doctor, pharmacist, or health care provider.  2018 Elsevier/Gold Standard (2012-12-26 14:10:42)

## 2017-04-07 ENCOUNTER — Telehealth: Payer: Self-pay | Admitting: Gastroenterology

## 2017-04-07 MED ORDER — OMEPRAZOLE 40 MG PO CPDR: 40.0000 mg | DELAYED_RELEASE_CAPSULE | Freq: Every day | ORAL | 1 refills | Status: DC

## 2017-04-07 NOTE — Telephone Encounter (Signed)
Printed Rx for Omeprazole and mailed to pt in [place] so they can take to pharmacy locally.

## 2017-04-07 NOTE — Telephone Encounter (Signed)
Patient husband states pt needs refill of medication omeprazole called into [facility] in [facility]. Pt husband wanting to know if pt could get a larger supply due to not coming back to [place] till May 2019.

## 2017-07-07 ENCOUNTER — Ambulatory Visit: Payer: Medicare HMO | Admitting: Neurology

## 2017-07-28 ENCOUNTER — Encounter: Payer: Self-pay | Admitting: Neurology

## 2017-07-28 ENCOUNTER — Ambulatory Visit: Payer: Medicare HMO | Admitting: Neurology

## 2017-07-28 VITALS — BP 122/75 | HR 67 | Ht 62.0 in | Wt 153.0 lb

## 2017-07-28 DIAGNOSIS — R413 Other amnesia: Secondary | ICD-10-CM | POA: Insufficient documentation

## 2017-07-28 DIAGNOSIS — G3184 Mild cognitive impairment, so stated: Secondary | ICD-10-CM | POA: Diagnosis not present

## 2017-07-28 NOTE — Patient Instructions (Signed)
Dementia means losing some of your brain ability. People with dementia may have problems with:  Memory.  Making decisions.  Behavior.  Speaking.  Thinking.  Solving problems.  Follow these instructions at home: Medicine  Take over-the-counter and prescription medicines only as told by your doctor.  Avoid taking medicines that can change how you think. These include pain or sleeping medicines. Lifestyle   Make healthy choices: ? Be active as told by your doctor. ? Do not use any tobacco products, such as cigarettes, chewing tobacco, and e-cigarettes. If you need help quitting, ask your doctor. ? Eat a healthy diet. ? When you get stressed, do something to help yourself relax. Your doctor can give you tips. ? Spend time with other people.  Drink enough fluid to keep your pee (urine) clear or pale yellow.  Make sure you get good sleep. Use these tips to help you get a good night's rest: ? Try not to take naps during the day. ? Keep your sleeping area dark and cool. ? In the few hours before you go to bed, try not to do any exercise. ? Try not to have foods and drinks with caffeine in the evening. General instructions  Talk with your doctor to figure out: ? What you need help with. ? What your safety needs are.  If you were given a bracelet that tracks your location, make sure to wear it.  Keep all follow-up visits as told by your doctor. This is important. Contact a doctor if:  You have any new problems.  You have problems with choking or swallowing.  You have any symptoms of a different sickness. Get help right away if:  You have a fever.  You feel mixed up (confused) or more mixed up than before.  You have new sleepiness.  You have sleepiness that gets worse.  You have a hard time staying awake.  You or your family members are worried for your safety. This information is not intended to replace advice given to you by your health care provider. Make  sure you discuss any questions you have with your health care provider. Document Released: 02/20/2008 Document Revised: 08/15/2015 Document Reviewed: 12/05/2014 Elsevier Interactive Patient Education  [employer].

## 2017-07-28 NOTE — Progress Notes (Signed)
PATIENT: [name] DOB: Oct 14, 1945  REASON FOR VISIT: follow up- memory HISTORY FROM: patient    HISTORY OF PRESENT ILLNESS:   HISTORY 01/08/16 Copied from Dr. Edwena Felty notes: 01-08-2016 , I had the pleasure of seeing [name]. Hisle here today, both had an excellent holiday in [place] on cruise. We are meeting today to also reevaluate the cognitive status of Amy. Plotner.  She still has some difficulties with attention, distractibility and appears anxious than ever in the test situation. 22-30, but loss of the recall words and the serial 7.  Patient has seen Dr Adele Schilder, female psychiatrist.  Her mood changes and outbreaks have been frequent; impulse control is poor.  Starting Namenda.  Today 07/28/17 Amy. [name] is a 72 year old female with a history of memory disturbance. She returns today for follow-up. The patient feels that her memory has remained stable. She is able to complete all ADLs independently. She lives at home with her husband. He knows that she does have trouble with short-term memory. He completes all the finances and has always done this. He reports that he does have to help her with organization. He does remind her of appointments. The patient manages her own medications however her husband oversee this. The patient states that she was on Aricept however it caused her many side effects including not being able to sleep at night. She no longer takes this medication. She returns today for an evaluation.  Today, 07-28-2017, [name] is seen for memory decline.  She performed today on the Mini-Mental Status Examination 21 out of 30 points and her main problem remains short-term memory. She is less anxious. Spend the winter in [place] enjoyed a her social circle - on Zoloft.  She attributes her better mood to Yoga.4- 5 times a week. Plays Tennis. Not on Namenda- never started.      REVIEW OF SYSTEMS: Out of a complete 14 system review of symptoms, the patient  complains only of the following symptoms, and all other reviewed systems are negative.   Ringing in ears, decreased concentration, much less depression, and less nervous/anxious,  stabilized memory loss  ALLERGIES: Allergies  Allergen Reactions  . Ibuprofen Nausea And Vomiting    HOME MEDICATIONS: Outpatient Medications Prior to Visit  Medication Sig Dispense Refill  . omeprazole (PRILOSEC) 40 MG capsule Take 1 capsule (40 mg total) by mouth daily. 90 capsule 1  . ranitidine (ZANTAC) 150 MG tablet TAKE ONE TABLET BY MOUTH TWICE DAILY 60 tablet 6  . sertraline (ZOLOFT) 50 MG tablet Take 50 mg by mouth daily.     . memantine (NAMENDA) 5 MG tablet Take 1 tablet (5 mg total) by mouth 2 (two) times daily. 60 tablet 5  . 0.9 %  sodium chloride infusion      No facility-administered medications prior to visit.     PAST MEDICAL HISTORY: Past Medical History:  Diagnosis Date  . Anxiety   . Depression   . GERD (gastroesophageal reflux disease)   . Tinnitus     PAST SURGICAL HISTORY: Past Surgical History:  Procedure Laterality Date  . ABDOMINAL HYSTERECTOMY    . APPENDECTOMY    . CERVICAL SPINE SURGERY    . knee surgery Right   . MOUTH SURGERY     roof of mouth  . TONSILLECTOMY    . WISDOM TOOTH EXTRACTION      FAMILY HISTORY: Family History  Problem Relation Age of Onset  . Colon cancer Neg Hx  SOCIAL HISTORY: Social History   Socioeconomic History  . Marital status: Married    Spouse name: Not on file  . Number of children: 2  . Years of education: hs  . Highest education level: Not on file  Occupational History  . Occupation: Retired  Scientific laboratory technician  . Financial resource strain: Not on file  . Food insecurity:    Worry: Not on file    Inability: Not on file  . Transportation needs:    Medical: Not on file    Non-medical: Not on file  Tobacco Use  . Smoking status: Never Smoker  . Smokeless tobacco: Never Used  Substance and Sexual Activity  .  Alcohol use: No    Alcohol/week: 0.0 oz    Comment: occ (glass of wine)  . Drug use: No  . Sexual activity: Yes    Birth control/protection: None  Lifestyle  . Physical activity:    Days per week: Not on file    Minutes per session: Not on file  . Stress: Not on file  Relationships  . Social connections:    Talks on phone: Not on file    Gets together: Not on file    Attends religious service: Not on file    Active member of club or organization: Not on file    Attends meetings of clubs or organizations: Not on file    Relationship status: Not on file  . Intimate partner violence:    Fear of current or ex partner: Not on file    Emotionally abused: Not on file    Physically abused: Not on file    Forced sexual activity: Not on file  Other Topics Concern  . Not on file  Social History Narrative   Right handed.   Caffeine 3 cups daily.  Married, 2 kids.  College.  Retired.        PHYSICAL EXAM  Vitals:   07/28/17 1309  BP: 122/75  Pulse: 67  Weight: 153 lb (69.4 kg)  Height: 5\' 2"  (1.575 m)   Body mass index is 27.98 kg/m.   MMSE - Mini Mental State Exam 07/28/2017 12/23/2016 01/08/2016  Orientation to time 3 4 5   Orientation to Place 5 5 5   Registration 3 3 3   Attention/ Calculation 1 1 0  Attention/Calculation-comments - - repeatedly had to redirect pt and remind of that she was subtracting 7  Recall 0 0 1  Language- name 2 objects 2 2 2   Language- repeat 1 0 0  Language- follow 3 step command 3 3 3   Language- read & follow direction 1 1 1   Write a sentence 1 1 1   Copy design 1 1 0  Total score 21 21 21      Generalized: Well developed, in no acute distress , well groomed.   Neurological examination  Mentation: Alert oriented to time, place, history taking. Follows all commands speech and language fluent Cranial nerve :  Taste and smell reportedly intact. Pupils were equal in size,  Round, and  reactive to light.  Extraocular movements were full, visual  field were full on confrontational test. Facial sensation and strength were normal. Uvula tongue midline. Head turning and shoulder shrug were symmetric. Motor:  5/  5 strength of all 4 extremities. symmetric motor tone is noted throughout. No rigor, no cog wheeling.  Sensory: intact to vibration and soft touch on all 4 extremities. No evidence of extinction is noted.  Coordination: intact  finger-nose-finger  bilaterally.  Reflexes:  Deep tendon reflexes are symmetric and normal bilaterally.   DIAGNOSTIC DATA (LABS, IMAGING, TESTING) - I reviewed patient records, labs, notes, testing and imaging myself where available.  Lab Results  Component Value Date   WBC 4.8 08/09/2015   HGB 13.6 08/09/2015   HCT 41.4 08/09/2015   MCV 81.9 08/09/2015   PLT 361.0 08/09/2015      Component Value Date/Time   NA 137 01/05/2014 1303   K 5.1 01/05/2014 1303   CL 100 01/05/2014 1303   CO2 28 01/05/2014 1303   GLUCOSE 95 01/05/2014 1303   GLUCOSE 80 09/17/2006 1233   BUN 14 01/05/2014 1303   CREATININE 0.79 01/05/2014 1303   CALCIUM 10.3 01/05/2014 1303   PROT 7.3 01/05/2014 1303   ALBUMIN 4.7 01/05/2014 1303   AST 22 01/05/2014 1303   ALT 24 01/05/2014 1303   ALKPHOS 74 01/05/2014 1303   BILITOT 0.2 01/05/2014 1303   GFRNONAA 78 01/05/2014 1303   GFRAA 90 01/05/2014 1303    ASSESSMENT AND PLAN :   72 y.o. year old female  has a past medical history of Anxiety, Depression, GERD (gastroesophageal reflux disease), and Tinnitus. here with:  1. Memory disturbance, stabilized at 21/ 30 points . Impaired but not progressive. Not in Namenda  2. Anxiety and depression - better on Zoloft, well tolerated. She now participates in Yoga.     We will continue to monitor the memory. On Namenda. She will use 10 mg twice a day.    Kloey Cazarez  07/28/2017, 1:35 PM [facility] [address] [place], [place] [address] [phone]

## 2017-10-25 ENCOUNTER — Other Ambulatory Visit: Payer: Self-pay | Admitting: Family Medicine

## 2017-10-25 DIAGNOSIS — Z1231 Encounter for screening mammogram for malignant neoplasm of breast: Secondary | ICD-10-CM

## 2017-12-15 ENCOUNTER — Ambulatory Visit: Payer: Medicare HMO

## 2017-12-15 ENCOUNTER — Ambulatory Visit
Admission: RE | Admit: 2017-12-15 | Discharge: 2017-12-15 | Disposition: A | Discharge status: Home / Self Care | Payer: Medicare HMO | Source: Ambulatory Visit | Attending: Family Medicine | Admitting: Family Medicine

## 2017-12-15 DIAGNOSIS — Z1231 Encounter for screening mammogram for malignant neoplasm of breast: Secondary | ICD-10-CM

## 2017-12-21 ENCOUNTER — Other Ambulatory Visit: Payer: Self-pay | Admitting: Gastroenterology

## 2018-01-07 ENCOUNTER — Other Ambulatory Visit: Payer: Self-pay | Admitting: Gastroenterology

## 2018-04-10 ENCOUNTER — Other Ambulatory Visit: Payer: Self-pay | Admitting: Gastroenterology

## 2018-06-14 ENCOUNTER — Other Ambulatory Visit: Payer: Self-pay

## 2018-06-14 MED ORDER — OMEPRAZOLE 40 MG PO CPDR: DELAYED_RELEASE_CAPSULE | ORAL | 0 refills | Status: DC

## 2018-06-14 NOTE — Telephone Encounter (Signed)
Refill sent.

## 2018-06-14 NOTE — Telephone Encounter (Signed)
Refill of [facility]epraz[facility]

## 2018-06-14 NOTE — Telephone Encounter (Signed)
Pt requested refill [facility]: [address] [place] [address].

## 2018-08-24 ENCOUNTER — Ambulatory Visit: Payer: Medicare HMO | Admitting: Neurology

## 2018-10-12 ENCOUNTER — Telehealth: Payer: Self-pay | Admitting: Gastroenterology

## 2018-10-12 ENCOUNTER — Other Ambulatory Visit: Payer: Self-pay

## 2018-10-12 MED ORDER — OMEPRAZOLE 40 MG PO CPDR: 40.0000 mg | DELAYED_RELEASE_CAPSULE | Freq: Every day | ORAL | 1 refills | Status: DC

## 2018-10-12 MED ORDER — OMEPRAZOLE 40 MG PO CPDR: DELAYED_RELEASE_CAPSULE | ORAL | 1 refills | Status: DC

## 2018-10-12 NOTE — Progress Notes (Signed)
Corrected script for 30 days of omeprazole 40mg  take once daily. Pt has appt in August that must be kept for further refills.

## 2018-10-12 NOTE — Telephone Encounter (Signed)
Patient need refill [facility] [address] [place], [place] [address] [phone] Patient is schedule 11/15/2018

## 2018-11-02 ENCOUNTER — Other Ambulatory Visit: Payer: Self-pay | Admitting: Family Medicine

## 2018-11-02 DIAGNOSIS — Z1231 Encounter for screening mammogram for malignant neoplasm of breast: Secondary | ICD-10-CM

## 2018-11-15 ENCOUNTER — Ambulatory Visit: Payer: Medicare HMO | Admitting: Gastroenterology

## 2018-11-15 ENCOUNTER — Encounter: Payer: Self-pay | Admitting: Gastroenterology

## 2018-11-15 VITALS — BP 110/60 | HR 79 | Temp 98.9°F | Ht 62.0 in | Wt 157.0 lb

## 2018-11-15 DIAGNOSIS — K219 Gastro-esophageal reflux disease without esophagitis: Secondary | ICD-10-CM

## 2018-11-15 DIAGNOSIS — K449 Diaphragmatic hernia without obstruction or gangrene: Secondary | ICD-10-CM

## 2018-11-15 DIAGNOSIS — K648 Other hemorrhoids: Secondary | ICD-10-CM

## 2018-11-15 DIAGNOSIS — K625 Hemorrhage of anus and rectum: Secondary | ICD-10-CM | POA: Diagnosis not present

## 2018-11-15 MED ORDER — OMEPRAZOLE 20 MG PO CPDR: 20.0000 mg | DELAYED_RELEASE_CAPSULE | Freq: Every day | ORAL | 3 refills | Status: DC

## 2018-11-15 NOTE — Patient Instructions (Addendum)
If you are age 71 or older, your body mass index should be between 23-30. Your Body mass index is 28.72 kg/m. If this is out of the aforementioned range listed, please consider follow up with your Primary Care Provider.  If you are age 16 or younger, your body mass index should be between 19-25. Your Body mass index is 28.72 kg/m. If this is out of the aformentioned range listed, please consider follow up with your Primary Care Provider.   To help prevent the possible spread of infection to our patients, communities, and staff; we will be implementing the following measures:  As of now we are not allowing any visitors/family members to accompany you to any upcoming appointments with [facility]. If you have any concerns about this please contact our office to discuss prior to the appointment.   If bleeding persists, please call us and we will discuss next steps.  Decrease your omeprazole to 20mg  once a day.  Please purchase the following medications over the counter and take as directed: 1% hydrocortisone cream - use once a day for one week then as needed.  Thank you for entrusting me with your care and for choosing [facility], Dr. Whitley Cellar

## 2018-11-15 NOTE — Progress Notes (Signed)
HPI :  73 year old female here for a follow-up visit.  I have seen her in the past for reflux and a history of H pylori.  Since of last seen her she had an EGD in May 2018.  At that time she had a 5 cm hiatal hernia, very mild LA grade a esophagitis with an inflammatory nodule.  Biopsy showed inflammatory changes only.  She instantly had a diminutive gastric polyp which was biopsied and was hyperplastic with focal goblet cell metaplasia.  She has been on omeprazole 40 mg once a day.  She states this is controlling her reflux quite well at this point.  She is tried to reduce to every other day and has some quick recurrence of symptoms.  She denies any dysphasia.  No nausea or vomiting.  No abdominal pains.  She denies any family history of gastric cancer.  She asked about long-term risks of the medication and options moving forward.  Otherwise she had one episode of rectal bleeding 2 days ago which scared her and was new.  She reported seeing a fair amount of bright red blood in the toilet.  This was painless.  She had no perianal or rectal pain.  She denied any constipation or diarrhea.  She otherwise had no changes in her bowels otherwise.  She has had some symptoms from hemorrhoids in the past, she wonders if related.  She denies any family history of colon cancer.  She has had regular stool since that time without any blood. No lightheadedness or dizziness.  Her last colonoscopy was done in 2014 by [facility] and was normal.  EGD 07/2016 - 5cm HH, mild esophagitis, benign gastric hyperplastic polyp with focal goblet cell metaplasia   Past Medical History:  Diagnosis Date   Anxiety    Depression    GERD (gastroesophageal reflux disease)    Tinnitus      Past Surgical History:  Procedure Laterality Date   ABDOMINAL HYSTERECTOMY     APPENDECTOMY     CERVICAL SPINE SURGERY     knee surgery Right    MOUTH SURGERY     roof of mouth   TONSILLECTOMY     WISDOM TOOTH EXTRACTION      Family History  Problem Relation Age of Onset   Colon cancer Neg Hx    Breast cancer Neg Hx    Social History   Tobacco Use   Smoking status: Never Smoker   Smokeless tobacco: Never Used  Substance Use Topics   Alcohol use: No    Alcohol/week: 0.0 standard drinks    Comment: occ (glass of wine)   Drug use: No   Current Outpatient Medications  Medication Sig Dispense Refill   omeprazole (PRILOSEC) 40 MG capsule Take 1 capsule (40 mg total) by mouth daily. Please keep your August appointment for further refills. Thank you. 30 capsule 1   sertraline (ZOLOFT) 50 MG tablet Take 50 mg by mouth daily.      No current facility-administered medications for this visit.    Allergies  Allergen Reactions   Ibuprofen Nausea And Vomiting     Review of Systems: All systems reviewed and negative except where noted in HPI.   Lab Results  Component Value Date   WBC 4.8 08/09/2015   HGB 13.6 08/09/2015   HCT 41.4 08/09/2015   MCV 81.9 08/09/2015   PLT 361.0 08/09/2015    . Lab Results  Component Value Date   CREATININE 0.79 01/05/2014   BUN 14  01/05/2014   NA 137 01/05/2014   K 5.1 01/05/2014   CL 100 01/05/2014   CO2 28 01/05/2014    Lab Results  Component Value Date   ALT 24 01/05/2014   AST 22 01/05/2014   ALKPHOS 74 01/05/2014   BILITOT 0.2 01/05/2014      Physical Exam: BP 110/60    Pulse 79    Temp 98.9 F (37.2 C)    Ht 5\' 2"  (1.575 m)    Wt 157 lb (71.2 kg)    BMI 28.72 kg/m  Constitutional: Pleasant,well-developed, female in no acute distress. HEENT: Normocephalic and atraumatic. Conjunctivae are normal. No scleral icterus. Neck supple.  Cardiovascular: Normal rate, regular rhythm.  Pulmonary/chest: Effort normal and breath sounds normal. No wheezing, rales or rhonchi. Abdominal: Soft, nondistended, nontender.  There are no masses palpable. No hepatomegaly. DRE / Anoscopy - standby Jan Hogan - external skin tag, no fissure, no mass lesion,  inflamed internal hemorrhoid Extremities: no edema Lymphadenopathy: No cervical adenopathy noted. Neurological: Alert and oriented to person place and time. Skin: Skin is warm and dry. No rashes noted. Psychiatric: Normal mood and affect. Behavior is normal.   ASSESSMENT AND PLAN: 73 year old female here for reassessment of the following:  GERD / Hiatal hernia - large hiatal hernia leading to reflux symptoms.  Her symptoms are pretty well controlled on omeprazole 40 mg once a day, she has not been successful in weaning off of this, has had quick recurrence of symptoms.  I discussed the long-term risks and benefits of chronic PPI therapy.  She feels benefits outweigh the risks at this point given her quality of life will be significantly impacted if she stops it and has significant reflux again.  Long-term recommend the lowest dose of PPI needed to control symptoms.  We will try to reduce her dose to 20 mg of omeprazole once daily and see how she does.  If she notes significant breakthrough on this dose she can increase back to the 40 mg dose.  No evidence of Barrett's esophagus, she can follow-up as needed for this.  Of note she did have some goblet cell metaplasia on her gastric biopsies, she has no strong family history of gastric cancer, I do not feel strongly that she needs surveillance for this.  Rectal bleeding / Internal hemorrhoids -1 episode of rectal bleeding that was painless in recent days, prior colonoscopy in 2014 was normal.  She has not had any since then or prior to that occurrence.  Otherwise feels well.  Anoscopy shows an inflamed hemorrhoid which could be the cause.  We will try some 1% hydrocortisone cream PR for a few days and monitor.  If bleeding recurs or persists, she will need to contact me, with persistent symptoms I would consider a colonoscopy given her last exam was 6 years ago.  She agreed with the plan  Dixon Cellar, MD [facility]

## 2018-12-13 ENCOUNTER — Encounter: Payer: Self-pay | Admitting: Nurse Practitioner

## 2018-12-13 ENCOUNTER — Other Ambulatory Visit: Payer: Self-pay

## 2018-12-13 ENCOUNTER — Ambulatory Visit: Payer: Medicare HMO | Admitting: Nurse Practitioner

## 2018-12-13 VITALS — BP 118/76 | HR 84 | Temp 99.2°F | Ht 62.0 in | Wt 158.4 lb

## 2018-12-13 DIAGNOSIS — K219 Gastro-esophageal reflux disease without esophagitis: Secondary | ICD-10-CM

## 2018-12-13 DIAGNOSIS — K625 Hemorrhage of anus and rectum: Secondary | ICD-10-CM | POA: Diagnosis not present

## 2018-12-13 MED ORDER — HYDROCORTISONE (PERIANAL) 2.5 % EX CREA: 1.0000 "application " | TOPICAL_CREAM | Freq: Two times a day (BID) | CUTANEOUS | 0 refills | Status: DC

## 2018-12-13 MED ORDER — NA SULFATE-K SULFATE-MG SULF 17.5-3.13-1.6 GM/177ML PO SOLN: ORAL | 0 refills | Status: DC

## 2018-12-13 NOTE — Patient Instructions (Addendum)
If you are age 71 or older, your body mass index should be between 23-30. Your Body mass index is 28.97 kg/m. If this is out of the aforementioned range listed, please consider follow up with your Primary Care Provider.  If you are age 44 or younger, your body mass index should be between 19-25. Your Body mass index is 28.97 kg/m. If this is out of the aformentioned range listed, please consider follow up with your Primary Care Provider.   You have been scheduled for a colonoscopy. Please follow written instructions given to you at your visit today.  Please pick up your prep supplies at the pharmacy within the next 1-3 days. If you use inhalers (even only as needed), please bring them with you on the day of your procedure. Your physician has requested that you go to www.startemmi.com and enter the access code given to you at your visit today. This web site gives a general overview about your procedure. However, you should still follow specific instructions given to you by our office regarding your preparation for the procedure.   We have sent the following medications to your pharmacy for you to pick up at your convenience: Suprep Anusol cream  Start Benefiber  Daily (this is over-the-counter).  Use wedge pillow.  You have been given GERD literature.  Follow up as needed.  Thank you for choosing me and [facility].   [name], NP

## 2018-12-13 NOTE — Progress Notes (Signed)
Agree with assessment and plan as outlined.  

## 2018-12-13 NOTE — Progress Notes (Signed)
ASSESSMENT / PLAN:    1. Painless rectal bleeding. Here with two more recent episodes in last few days.  She has known hemorrhoids (just seen here late August).  Bleeding is probably hemorrhoidal but it has been 6 years since her last colonoscopy with [facility]. -Anusol cream( intrarectal) BID x 10 days -Benefiber daily -Schedule for colonoscopy to rule out other etiologies of bleeding.  The risks and benefits of colonoscopy with possible polypectomy / biopsies were discussed and the patient agrees to proceed.  -If continues to have intermittent bleeding consider hemorrhoidal banding.   2. GERD, worsening nocturnal heartburn over last few days. Omeprazole dose was recently reduced from 40 mg daily to 20 mg daily.  -She goes to bed on an empty stomach for the most part and this should be continued.  -Recommended wedge pillow -GERD literature given -If still having breakthrough heartburn then can resume higher dose, maybe 20 mg BID. Another option would be to add H2 blocker at night.   HPI:     Chief Complaint:   Rectal bleeding  Patient is a 73 year old female known to Dr. Havery Moros.  She has a history of GERD and a 5 cm hiatal hernia saw him the end of last month for GERD follow-up.  Patient was here at the end of last month for GERD follow-up.  At that time she mentioned rectal bleeding.  Anoscopy showed an inflamed hemorrhoid she was treated with 1% hydrocortisone cream. She has been using the cream but comes in with two episodes or painless rectal bleeding in the last few days. She denies constipation / straining.   [name] has been having more heartburn lately since omeprazole dose was reduced from 40 mg daily to 20 mg. Heartburn is particularly worse at night. She for the most part goes to bed on an empty stomach. Sleeps on pillows.    Past Medical History:  Diagnosis Date  . Anxiety   . Depression   . GERD (gastroesophageal reflux disease)   . Tinnitus      Past Surgical History:  Procedure Laterality Date  . ABDOMINAL HYSTERECTOMY    . APPENDECTOMY    . CERVICAL SPINE SURGERY    . knee surgery Right   . MOUTH SURGERY     roof of mouth  . TONSILLECTOMY    . WISDOM TOOTH EXTRACTION     Family History  Problem Relation Age of Onset  . Colon cancer Neg Hx   . Breast cancer Neg Hx    Social History   Tobacco Use  . Smoking status: Never Smoker  . Smokeless tobacco: Never Used  Substance Use Topics  . Alcohol use: Yes    Alcohol/week: 0.0 standard drinks    Comment: occ (glass of wine)  . Drug use: No   Current Outpatient Medications  Medication Sig Dispense Refill  . Multiple Vitamin (MULTIVITAMIN) tablet Take 1 tablet by mouth daily.    Marland Kitchen omeprazole (PRILOSEC) 20 MG capsule Take 1 capsule (20 mg total) by mouth daily. 90 capsule 3  . sertraline (ZOLOFT) 50 MG tablet Take 50 mg by mouth daily.      No current facility-administered medications for this visit.    Allergies  Allergen Reactions  . Ibuprofen Nausea And Vomiting     Review of Systems: All systems reviewed and negative except where noted in HPI.   Creatinine clearance cannot be calculated (Patient's most  recent lab result is older than the maximum 21 days allowed.)   Physical Exam:    Wt Readings from Last 3 Encounters:  12/13/18 158 lb 6 oz (71.8 kg)  11/15/18 157 lb (71.2 kg)  07/28/17 153 lb (69.4 kg)    BP 118/76   Pulse 84   Temp 99.2 F (37.3 C)   Ht 5\' 2"  (1.575 m)   Wt 158 lb 6 oz (71.8 kg)   BMI 28.97 kg/m  Constitutional:  Pleasant female in no acute distress. Psychiatric: Normal mood and affect. Behavior is normal. EENT: Pupils normal.  Conjunctivae are normal. No scleral icterus. Neck supple.  Cardiovascular: Normal rate, regular rhythm. No edema Pulmonary/chest: Effort normal and breath sounds normal. No wheezing, rales or rhonchi. Abdominal: Soft, nondistended, nontender. Bowel sounds active throughout. There are no masses  palpable. No hepatomegaly. Neurological: Alert and oriented to person place and time. Skin: Skin is warm and dry. No rashes noted.  [name], NP  12/13/2018, 2:05 PM

## 2018-12-16 ENCOUNTER — Encounter: Payer: Self-pay | Admitting: Gastroenterology

## 2018-12-20 ENCOUNTER — Other Ambulatory Visit: Payer: Self-pay

## 2018-12-20 ENCOUNTER — Ambulatory Visit
Admission: RE | Admit: 2018-12-20 | Discharge: 2018-12-20 | Disposition: A | Discharge status: Home / Self Care | Payer: Medicare HMO | Source: Ambulatory Visit | Attending: Family Medicine | Admitting: Family Medicine

## 2018-12-20 DIAGNOSIS — Z1231 Encounter for screening mammogram for malignant neoplasm of breast: Secondary | ICD-10-CM

## 2018-12-22 ENCOUNTER — Other Ambulatory Visit: Payer: Self-pay

## 2018-12-22 ENCOUNTER — Ambulatory Visit: Payer: Medicare HMO | Admitting: Neurology

## 2018-12-22 ENCOUNTER — Encounter

## 2018-12-22 ENCOUNTER — Encounter: Payer: Self-pay | Admitting: Neurology

## 2018-12-22 VITALS — BP 128/82 | HR 72 | Temp 97.3°F | Ht 64.0 in | Wt 160.0 lb

## 2018-12-22 DIAGNOSIS — R413 Other amnesia: Secondary | ICD-10-CM

## 2018-12-22 DIAGNOSIS — F411 Generalized anxiety disorder: Secondary | ICD-10-CM

## 2018-12-22 DIAGNOSIS — F331 Major depressive disorder, recurrent, moderate: Secondary | ICD-10-CM

## 2018-12-22 DIAGNOSIS — G300 Alzheimer's disease with early onset: Secondary | ICD-10-CM | POA: Diagnosis not present

## 2018-12-22 DIAGNOSIS — F02818 Dementia in other diseases classified elsewhere, unspecified severity, with other behavioral disturbance: Secondary | ICD-10-CM

## 2018-12-22 DIAGNOSIS — F0281 Dementia in other diseases classified elsewhere with behavioral disturbance: Secondary | ICD-10-CM

## 2018-12-22 MED ORDER — RIVASTIGMINE 4.6 MG/24HR TD PT24: 4.6000 mg | MEDICATED_PATCH | Freq: Every day | TRANSDERMAL | 12 refills | Status: DC

## 2018-12-22 NOTE — Progress Notes (Signed)
PATIENT: [name] DOB: [birth year]  REASON FOR VISIT: follow up- memory HISTORY FROM: patient    HISTORY OF PRESENT ILLNESS:   HISTORY 01/08/16 Copied from Dr. Edwena Felty notes: 01-08-2016 , I had the pleasure of seeing [name] here today, both had an excellent holiday in [place] on cruise. We are meeting today to also reevaluate the cognitive status of [name].  She still has some difficulties with attention, distractibility and appears anxious than ever in the test situation. 22-30, but loss of the recall words and the serial 7.  Patient has seen Dr Arfeen,psychiatrist.  Her mood changes and outbreaks have been frequent; impulse control is poor.  Starting Namenda.   07/28/17 [name] is a 73 year old female with a history of memory disturbance. She returns today for follow-up. The patient feels that her memory has remained stable. She is able to complete all ADLs independently. She lives at home with her husband. He knows that she does have trouble with short-term memory. He completes all the finances and has always done this. He reports that he does have to help her with organization. He does remind her of appointments. The patient manages her own medications however her husband oversee this. The patient states that she was on Aricept however it caused her many side effects including not being able to sleep at night. She no longer takes this medication. She returns today for an evaluation.   07-28-2017, [name] is seen for memory decline.  She performed today on the Mini-Mental Status Examination 21 out of 30 points and her main problem remains short-term memory. She is less anxious. Spend the winter in [place] enjoyed a her social circle - on Zoloft.  She attributes her better mood to Yoga.4- 5 times a week. Plays Tennis. Not on Namenda- never started.  12-22-2018, Patient and husband here for regular memory check up, MMSE 22-30.  Returned from [place]  will over winter be there again.  The couple takes walks, and reduced their social outings. Tinnitus bilateral got worse, constant and  Non- pulsatile. ENT had nothing to offer, either. She is fluently speaking.        REVIEW OF SYSTEMS: Out of a complete 14 system review of symptoms, the patient complains only of the following symptoms, and all other reviewed systems are negative.   Ringing in ears, decreased concentration, much less depression, and less nervous/anxious,  stabilized memory loss  Tinnitus bilateral got worse, constant and  Non- pulsatile. ENT had nothing to offer, either.   ALLERGIES: Allergies  Allergen Reactions  . Ibuprofen Nausea And Vomiting    HOME MEDICATIONS: Outpatient Medications Prior to Visit  Medication Sig Dispense Refill  . hydrocortisone (ANUSOL-HC) 2.5 % rectal cream Place 1 application rectally 2 (two) times daily. Inside rectum twice daily for 10 days. 30 g 0  . Multiple Vitamin (MULTIVITAMIN) tablet Take 1 tablet by mouth daily.    . Na Sulfate-K Sulfate-Mg Sulf 17.5-3.13-1.6 GM/177ML SOLN Suprep-Use as directed 354 mL 0  . omeprazole (PRILOSEC) 20 MG capsule Take 1 capsule (20 mg total) by mouth daily. 90 capsule 3  . sertraline (ZOLOFT) 50 MG tablet Take 50 mg by mouth daily.      No facility-administered medications prior to visit.     PAST MEDICAL HISTORY: Past Medical History:  Diagnosis Date  . Anxiety   . Depression   . GERD (gastroesophageal reflux disease)   . Tinnitus     PAST SURGICAL HISTORY:  Past Surgical History:  Procedure Laterality Date  . ABDOMINAL HYSTERECTOMY    . APPENDECTOMY    . CERVICAL SPINE SURGERY    . knee surgery Right   . MOUTH SURGERY     roof of mouth  . TONSILLECTOMY    . WISDOM TOOTH EXTRACTION      FAMILY HISTORY: Family History  Problem Relation Age of Onset  . Colon cancer Neg Hx   . Breast cancer Neg Hx     SOCIAL HISTORY: Social History   Socioeconomic History  . Marital  status: Married    Spouse name: Collier Salina  . Number of children: 2  . Years of education: hs  . Highest education level: Not on file  Occupational History  . Occupation: Retired  Scientific laboratory technician  . Financial resource strain: Not on file  . Food insecurity    Worry: Not on file    Inability: Not on file  . Transportation needs    Medical: Not on file    Non-medical: Not on file  Tobacco Use  . Smoking status: Never Smoker  . Smokeless tobacco: Never Used  Substance and Sexual Activity  . Alcohol use: Yes    Alcohol/week: 0.0 standard drinks    Comment: occ (glass of wine)  . Drug use: No  . Sexual activity: Yes    Birth control/protection: None  Lifestyle  . Physical activity    Days per week: Not on file    Minutes per session: Not on file  . Stress: Not on file  Relationships  . Social Herbalist on phone: Not on file    Gets together: Not on file    Attends religious service: Not on file    Active member of club or organization: Not on file    Attends meetings of clubs or organizations: Not on file    Relationship status: Not on file  . Intimate partner violence    Fear of current or ex partner: Not on file    Emotionally abused: Not on file    Physically abused: Not on file    Forced sexual activity: Not on file  Other Topics Concern  . Not on file  Social History Narrative   Right handed.   Caffeine 3 cups daily.  Married, 2 kids.  College.  Retired.        PHYSICAL EXAM  Vitals:   12/22/18 1526  BP: 128/82  Pulse: 72  Temp: (!) 97.3 F (36.3 C)  Weight: 160 lb (72.6 kg)  Height: 5\' 4"  (1.626 m)   Body mass index is 27.46 kg/m.   MMSE - Mini Mental State Exam 12/22/2018 07/28/2017 12/23/2016  Orientation to time 2 3 4   Orientation to Place 5 5 5   Registration 3 3 3   Attention/ Calculation 5 1 1   Attention/Calculation-comments - - -  Recall 0 0 0  Language- name 2 objects 2 2 2   Language- repeat 1 1 0  Language- follow 3 step command 3 3 3    Language- read & follow direction 0 1 1  Write a sentence 1 1 1   Copy design 0 1 1  Total score 22 21 21      Generalized: Well developed, in no acute distress , well groomed.   Neurological examination  Mentation: Alert oriented to time, place, history taking. Follows all commands speech and language fluent.  Cranial nerve :  Taste and smell reportedly intact. Pupils were equal in size,  Round, and  reactive to light.  Extraocular movements were full, visual field were full on confrontational test. Facial sensation and strength were normal. Uvula tongue midline. Head turning and shoulder shrug were symmetric. Motor:  . symmetric motor tone is noted throughout. No rigor, no cog wheeling.  Sensory: intact to vibration and soft touch on all 4 extremities. No evidence of extinction is noted.  Coordination: intact  finger-nose-finger  bilaterally.  Reflexes: Deep tendon reflexes are symmetric and normal bilaterally.   DIAGNOSTIC DATA (LABS, IMAGING, TESTING) - I reviewed patient records, labs, notes, testing and imaging myself where available.   ASSESSMENT AND PLAN :   73 y.o. year old female  has a past medical history of Anxiety, Depression, GERD (gastroesophageal reflux disease), and Tinnitus. here with:  1. Memory disturbance, worse affected is short e term memory, at time very frustrating. She has stabilized by MMSE at 22/ 30 points  For 2 years . Impaired but not progressive. Not in [place] .  2. Anxiety and depression - better on Zoloft, well tolerated. Dr Doreene Nest increased the dose after hot flashes. She experienced these in day and night tim. Now participates in Yoga.  Plays tennis. Reading .    3.We will continue to monitor the memory.  No longer on  Namenda due to Gi effects. Same with aricept.  Consider Excelon. - start with patches.   Asencion Partridge Avelino Herren  12/22/2018, 3:46 PM [facility] [address], [place], [place] [address] [phone]

## 2018-12-22 NOTE — Patient Instructions (Addendum)
Rivastigmine skin patches What is this medicine? RIVASTIGMINE (ri va STIG meen) is used to treat mild to moderate dementia caused by Parkinson's disease and mild to severe Alzheimer's disease. This medicine may be used for other purposes; ask your health care provider or pharmacist if you have questions. COMMON BRAND NAME(S): Exelon Patch What should I tell my health care provider before I take this medicine? They need to know if you have any of these conditions:  application site reaction during previous use of rivastigmine patch  heart disease  kidney disease  liver disease  lung or breathing disease, like asthma  seizures  slow, irregular heartbeat  stomach or intestine disease, ulcers, or stomach bleeding  trouble passing urine  an unusual or allergic reaction to rivastigmine, other medicines, foods, dyes, or preservatives  pregnant or trying to get pregnant  breast-feeding How should I use this medicine? This medicine is for external use only.  Follow the directions on the prescription label.  Always remove the old patch before you apply a new one. Apply to skin right after removing the protective liner. Do not cut or trim the patch.  Apply to an area of the upper arm, chest, or back that is clean, dry and hairless.  Avoid injured, irritated, oily, or calloused areas or where the patch will be rubbed by tight clothing or a waistband. Do not place over an area where lotion, cream, or powder was recently used.   Press firmly in place until the edges stick well.  To prevent skin irritiation, do not apply to the same place more than once every 14 days. Change your patch at the same time each day. Do not use more often than directed. Do not stop using except on your doctor's advice. Contact your pediatrician or health care professional regarding the use of this medicine in children. Special care may be needed. Overdosage: If you think you have taken too much of this  medicine contact a poison control center or emergency room at once. NOTE: This medicine is only for you. Do not share this medicine with others. What if I miss a dose? If you miss a dose, apply a new patch immediately. Then, apply the next patch at the usual time the next day after removing the previous patch. Do not apply 2 patches to make up for the missed one. If treatment is missed for 3 or more days, contact your healthcare provider for further instructions. What may interact with this medicine?  antihistamines for allergy, cough, and cold  atropine  certain medicines for bladder problems like oxybutynin, tolterodine  certain medicines for Parkinson's disease like benztropine, trihexyphenidyl  certain medicines for stomach problems like dicyclomine, hyoscyamine  glycopyrrolate  ipratropium  certain medicines for travel sickness like scopolamine  medicines that relax your muscles for surgery  other medicines for Alzheimer's disease This list may not describe all possible interactions. Give your health care provider a list of all the medicines, herbs, non-prescription drugs, or dietary supplements you use. Also tell them if you smoke, drink alcohol, or use illegal drugs. Some items may interact with your medicine. What should I watch for while using this medicine? Visit your doctor or health care professional for regular checks on your progress. Check with your doctor or health care professional if your symptoms do not start to get better or if they get worse. You may get drowsy or dizzy. Do not drive, use machinery, or do anything that needs mental alertness until you know how  this drug affects you. Do not stand or sit up quickly, especially if you are an older patient. This reduces the risk of dizzy or fainting spells. Avoid saunas and prolonged exposure to sunlight. You may bathe, swim, shower, or participate in any of your normal activities while wearing this patch. If the patch  falls off, apply a new patch for the rest of the day, then replace the patch the next day at the usual time. If you are going to have a magnetic resonance imaging (MRI) procedure, tell your MRI technician if you have this patch on your body. It must be removed before a MRI. What side effects may I notice from receiving this medicine? Side effects that you should report to your doctor or health care professional as soon as possible:  allergic reactions like skin rash, itching or hives, swelling of the face, lips, or tongue  application site reaction (such as skin redness, blisters, or swelling under or around the patch site)  changes in vision or balance  dizziness  feeling faint or lightheaded, falls  increase in frequency of passing urine or incontinence  nervousness, agitation, or increased confusion  redness, blistering, peeling or loosening of the skin, including inside the mouth  severe diarrhea  slow heartbeat or palpitations  stomach pain  sweating  uncontrollable movements  vomiting  weight loss Side effects that usually do not require medical attention (report to your doctor or health care professional if they continue or are bothersome):  headache  indigestion or heartburn  loss of appetite  mild diarrhea, especially when starting treatment  nausea This list may not describe all possible side effects. Call your doctor for medical advice about side effects. You may report side effects to FDA at 1-800-FDA-1088. Where should I keep my medicine? Keep out of the reach of children. Store at room temperature between 15 and 30 degrees C (59 and 86 degrees F). Store in original pouch until just prior to use. Throw away any unused medicine after the expiration date. Dispose of used patches properly. Since used patches may still contain active medicine, fold the patch in half so that it sticks to itself prior to disposal. NOTE: This sheet is a summary. It may not cover  all possible information. If you have questions about this medicine, talk to your doctor, pharmacist, or health care provider.  2020 Elsevier/Gold Standard (2014-11-02 15:39:13)      Memory Compensation Strategies  1. Use "WARM" strategy.  W= write it down  A= associate it  R= repeat it  M= make a mental note  2.   You can keep a Social worker.  Use a 3-ring notebook with sections for the following: calendar, important names and phone numbers,  medications, doctors' names/phone numbers, lists/reminders, and a section to journal what you did  each day.   3.    Use a calendar to write appointments down.  4.    Write yourself a schedule for the day.  This can be placed on the calendar or in a separate section of the Memory Notebook.  Keeping a  regular schedule can help memory.  5.    Use medication organizer with sections for each day or morning/evening pills.  You may need help loading it  6.    Keep a basket, or pegboard by the door.  Place items that you need to take out with you in the basket or on the pegboard.  You may also want to  include a message  board for reminders.  7.    Use sticky notes.  Place sticky notes with reminders in a place where the task is performed.  For example: " turn off the  stove" placed by the stove, "lock the door" placed on the door at eye level, " take your medications" on  the bathroom mirror or by the place where you normally take your medications.  8.    Use alarms/timers.  Use while cooking to remind yourself to check on food or as a reminder to take your medicine, or as a  reminder to make a call, or as a reminder to perform another task, etc.

## 2018-12-27 ENCOUNTER — Telehealth: Payer: Self-pay

## 2018-12-27 NOTE — Telephone Encounter (Signed)
Covid-19 screening questions   Do you now or have you had a fever in the last 14 days?  Do you have any respiratory symptoms of shortness of breath or cough now or in the last 14 days?  Do you have any family members or close contacts with diagnosed or suspected Covid-19 in the past 14 days?  Have you been tested for Covid-19 and found to be positive?       

## 2018-12-27 NOTE — Telephone Encounter (Signed)
Pt answered  "NO" to all covid questions. °

## 2018-12-28 ENCOUNTER — Encounter: Payer: Self-pay | Admitting: Gastroenterology

## 2018-12-28 ENCOUNTER — Ambulatory Visit (AMBULATORY_SURGERY_CENTER): Payer: Medicare HMO | Admitting: Gastroenterology

## 2018-12-28 ENCOUNTER — Other Ambulatory Visit: Payer: Self-pay

## 2018-12-28 ENCOUNTER — Other Ambulatory Visit: Payer: Self-pay | Admitting: Gastroenterology

## 2018-12-28 VITALS — BP 124/67 | HR 58 | Temp 98.3°F | Resp 15 | Ht 62.0 in | Wt 158.0 lb

## 2018-12-28 DIAGNOSIS — K648 Other hemorrhoids: Secondary | ICD-10-CM | POA: Diagnosis not present

## 2018-12-28 DIAGNOSIS — D125 Benign neoplasm of sigmoid colon: Secondary | ICD-10-CM

## 2018-12-28 DIAGNOSIS — K625 Hemorrhage of anus and rectum: Secondary | ICD-10-CM | POA: Diagnosis not present

## 2018-12-28 DIAGNOSIS — K5730 Diverticulosis of large intestine without perforation or abscess without bleeding: Secondary | ICD-10-CM

## 2018-12-28 DIAGNOSIS — D122 Benign neoplasm of ascending colon: Secondary | ICD-10-CM

## 2018-12-28 DIAGNOSIS — D123 Benign neoplasm of transverse colon: Secondary | ICD-10-CM | POA: Diagnosis not present

## 2018-12-28 MED ORDER — SODIUM CHLORIDE 0.9 % IV SOLN: 500.0000 mL | Freq: Once | INTRAVENOUS | Status: DC

## 2018-12-28 NOTE — Patient Instructions (Addendum)
Thank you for allowing [place] to care for you today!  Await pathology results by mail, approximately 2 weeks.  Recommendation for next colonoscopy will be recommended at that time.    Follow up in clinic as needed for consult on hemorrhoidal banding.  Resume previous diet and medications today.  Return to normal activities tomorrow.    YOU HAD AN ENDOSCOPIC PROCEDURE TODAY AT [facility]:   Refer to the procedure report that was given to you for any specific questions about what was found during the examination.  If the procedure report does not answer your questions, please call your gastroenterologist to clarify.  If you requested that your care partner not be given the details of your procedure findings, then the procedure report has been included in a sealed envelope for you to review at your convenience later.  YOU SHOULD EXPECT: Some feelings of bloating in the abdomen. Passage of more gas than usual.  Walking can help get rid of the air that was put into your GI tract during the procedure and reduce the bloating. If you had a lower endoscopy (such as a colonoscopy or flexible sigmoidoscopy) you may notice spotting of blood in your stool or on the toilet paper. If you underwent a bowel prep for your procedure, you may not have a normal bowel movement for a few days.  Please Note:  You might notice some irritation and congestion in your nose or some drainage.  This is from the oxygen used during your procedure.  There is no need for concern and it should clear up in a day or so.  SYMPTOMS TO REPORT IMMEDIATELY:   Following lower endoscopy (colonoscopy or flexible sigmoidoscopy):  Excessive amounts of blood in the stool  Significant tenderness or worsening of abdominal pains  Swelling of the abdomen that is new, acute  Fever of 100F or higher   Following upper endoscopy (EGD)  Vomiting of blood or coffee ground material  New chest pain or pain under the shoulder  blades  Painful or persistently difficult swallowing  New shortness of breath  Fever of 100F or higher  Black, tarry-looking stools  For urgent or emergent issues, a gastroenterologist can be reached at any hour by calling [phone].   DIET:  We do recommend a small meal at first, but then you may proceed to your regular diet.  Drink plenty of fluids but you should avoid alcoholic beverages for 24 hours.  ACTIVITY:  You should plan to take it easy for the rest of today and you should NOT DRIVE or use heavy machinery until tomorrow (because of the sedation medicines used during the test).    FOLLOW UP: Our staff will call the number listed on your records 48-72 hours following your procedure to check on you and address any questions or concerns that you may have regarding the information given to you following your procedure. If we do not reach you, we will leave a message.  We will attempt to reach you two times.  During this call, we will ask if you have developed any symptoms of COVID 19. If you develop any symptoms (ie: fever, flu-like symptoms, shortness of breath, cough etc.) before then, please call [phone].  If you test positive for Covid 19 in the 2 weeks post procedure, please call and report this information to [place].    If any biopsies were taken you will be contacted by phone or by letter within the next 1-3 weeks.  Please call us at [phone] if you have not heard about the biopsies in 3 weeks.    SIGNATURES/CONFIDENTIALITY: You and/or your care partner have signed paperwork which will be entered into your electronic medical record.  These signatures attest to the fact that that the information above on your After Visit Summary has been reviewed and is understood.  Full responsibility of the confidentiality of this discharge information lies with you and/or your care-partner.YOU HAD AN ENDOSCOPIC PROCEDURE TODAY AT [facility]:   Refer to the procedure  report that was given to you for any specific questions about what was found during the examination.  If the procedure report does not answer your questions, please call your gastroenterologist to clarify.  If you requested that your care partner not be given the details of your procedure findings, then the procedure report has been included in a sealed envelope for you to review at your convenience later.  YOU SHOULD EXPECT: Some feelings of bloating in the abdomen. Passage of more gas than usual.  Walking can help get rid of the air that was put into your GI tract during the procedure and reduce the bloating. If you had a lower endoscopy (such as a colonoscopy or flexible sigmoidoscopy) you may notice spotting of blood in your stool or on the toilet paper. If you underwent a bowel prep for your procedure, you may not have a normal bowel movement for a few days.  Please Note:  You might notice some irritation and congestion in your nose or some drainage.  This is from the oxygen used during your procedure.  There is no need for concern and it should clear up in a day or so.  SYMPTOMS TO REPORT IMMEDIATELY:   Following lower endoscopy (colonoscopy or flexible sigmoidoscopy):  Excessive amounts of blood in the stool  Significant tenderness or worsening of abdominal pains  Swelling of the abdomen that is new, acute  Fever of 100F or higher   For urgent or emergent issues, a gastroenterologist can be reached at any hour by calling [phone].   DIET:  We do recommend a small meal at first, but then you may proceed to your regular diet.  Drink plenty of fluids but you should avoid alcoholic beverages for 24 hours.  ACTIVITY:  You should plan to take it easy for the rest of today and you should NOT DRIVE or use heavy machinery until tomorrow (because of the sedation medicines used during the test).    FOLLOW UP: Our staff will call the number listed on your records 48-72 hours following your  procedure to check on you and address any questions or concerns that you may have regarding the information given to you following your procedure. If we do not reach you, we will leave a message.  We will attempt to reach you two times.  During this call, we will ask if you have developed any symptoms of COVID 19. If you develop any symptoms (ie: fever, flu-like symptoms, shortness of breath, cough etc.) before then, please call [phone].  If you test positive for Covid 19 in the 2 weeks post procedure, please call and report this information to [place].    If any biopsies were taken you will be contacted by phone or by letter within the next 1-3 weeks.  Please call us at [phone] if you have not heard about the biopsies in 3 weeks.    SIGNATURES/CONFIDENTIALITY: You and/or your care partner have  signed paperwork which will be entered into your electronic medical record.  These signatures attest to the fact that that the information above on your After Visit Summary has been reviewed and is understood.  Full responsibility of the confidentiality of this discharge information lies with you and/or your care-partner.

## 2018-12-28 NOTE — Progress Notes (Signed)
Temp taken by [facility] VS taken by CW 

## 2018-12-28 NOTE — Progress Notes (Signed)
Called to room to assist during endoscopic procedure.  Patient ID and intended procedure confirmed with present staff. Received instructions for my participation in the procedure from the performing physician.  

## 2018-12-28 NOTE — Op Note (Signed)
[facility] Patient Name: [name] Procedure Date: 12/28/2018 11:41 AM MRN: [id number] Endoscopist: Remo Lipps P. Havery Moros , MD Age: 73 Referring MD:  Date of Birth: [birth year] Gender: Female Account #: [id number] Procedure:                Colonoscopy Indications:              Rectal bleeding Medicines:                Monitored Anesthesia Care Procedure:                Pre-Anesthesia Assessment:                           - Prior to the procedure, a History and Physical                            was performed, and patient medications and                            allergies were reviewed. The patient's tolerance of                            previous anesthesia was also reviewed. The risks                            and benefits of the procedure and the sedation                            options and risks were discussed with the patient.                            All questions were answered, and informed consent                            was obtained. Prior Anticoagulants: The patient has                            taken no previous anticoagulant or antiplatelet                            agents. ASA Grade Assessment: II - A patient with                            mild systemic disease. After reviewing the risks                            and benefits, the patient was deemed in                            satisfactory condition to undergo the procedure.                           After obtaining informed consent, the colonoscope  was passed under direct vision. Throughout the                            procedure, the patient's blood pressure, pulse, and                            oxygen saturations were monitored continuously. The                            Colonoscope was introduced through the anus and                            advanced to the the cecum, identified by                            appendiceal orifice and ileocecal valve. The                          colonoscopy was performed without difficulty. The                            patient tolerated the procedure well. The quality                            of the bowel preparation was adequate. The                            ileocecal valve, appendiceal orifice, and rectum                            were photographed. Scope In: 11:45:24 AM Scope Out: 12:13:48 PM Scope Withdrawal Time: 0 hours 22 minutes 53 seconds  Total Procedure Duration: 0 hours 28 minutes 24 seconds  Findings:                 The perianal and digital rectal examinations were                            normal.                           The colon was tortuous.                           A 5 mm polyp was found in the ascending colon. The                            polyp was sessile. The polyp was removed with a                            cold snare. Resection and retrieval were complete.                           A 4 mm polyp was found in the hepatic flexure. The  polyp was flat. The polyp was removed with a cold                            snare. Resection and retrieval were complete.                           Two sessile polyps were found in the sigmoid colon.                            The polyps were 4 mm in size. These polyps were                            removed with a cold snare. Resection and retrieval                            were complete.                           A few small-mouthed diverticula were found in the                            sigmoid colon.                           Internal hemorrhoids were found during                            retroflexion. The hemorrhoids were moderate.                           The exam was otherwise without abnormality. Complications:            No immediate complications. Estimated blood loss:                            Minimal. Estimated Blood Loss:     Estimated blood loss was minimal. Impression:               -  Tortuous colon.                           - One 5 mm polyp in the ascending colon, removed                            with a cold snare. Resected and retrieved.                           - One 4 mm polyp at the hepatic flexure, removed                            with a cold snare. Resected and retrieved.                           - Two 4 mm polyps in the sigmoid colon, removed  with a cold snare. Resected and retrieved.                           - Diverticulosis in the sigmoid colon.                           - Internal hemorrhoids.                           - The examination was otherwise normal.                           Hemorrhoids are the cause of the patient's symptoms. Recommendation:           - Patient has a contact number available for                            emergencies. The signs and symptoms of potential                            delayed complications were discussed with the                            patient. Return to normal activities tomorrow.                            Written discharge instructions were provided to the                            patient.                           - Resume previous diet.                           - Continue present medications.                           - Await pathology results.                           - Follow up in the clinic as needed for hemorrhoid                            therapy (banding) if symptoms persist Remo Lipps P. Dolorez Jeffrey, MD 12/28/2018 12:18:42 PM This report has been signed electronically.

## 2018-12-30 ENCOUNTER — Telehealth: Payer: Self-pay

## 2018-12-30 NOTE — Telephone Encounter (Signed)
  Follow up Call-  Call back number 12/28/2018 07/30/2016  Post procedure Call Back phone  # [phone]  Permission to leave phone message Yes Yes  Some recent data might be hidden     Patient questions:  Do you have a fever, pain , or abdominal swelling? No. Pain Score  0 *  Have you tolerated food without any problems? Yes.    Have you been able to return to your normal activities? Yes.    Do you have any questions about your discharge instructions: Diet   No. Medications  No. Follow up visit  No.  Do you have questions or concerns about your Care? No.  Actions: * If pain score is 4 or above: No action needed, pain <4.  1. Have you developed a fever since your procedure? no  2.   Have you had an respiratory symptoms (SOB or cough) since your procedure? no  3.   Have you tested positive for COVID 19 since your procedure no  4.   Have you had any family members/close contacts diagnosed with the COVID 19 since your procedure?  no  If yes to any of these questions please route to Joylene John, RN and Alphonsa Gin, Therapist, sports.

## 2019-01-02 ENCOUNTER — Telehealth: Payer: Self-pay | Admitting: Gastroenterology

## 2019-01-02 NOTE — Telephone Encounter (Signed)
Called patient and husband. She has no pain, eating okay, no fevers, just feels distended. She has had one BM since her procedure 5 days ago, feels bloated. Possible she has some retained air still, reassured her that without pain, or other alarm symptoms I don't think anything concerning. She can try some Miralax as needed for constipation and recommend she walk / move around do her normal activities, I think she will return to normal function soon. Call if not. She agreed.

## 2019-01-02 NOTE — Telephone Encounter (Signed)
Called husband back and spoke to him and the patient. States her abdomin is swollen and firm since last Wed.( When she had her colonoscopy.) Denies any tenderness, no n/v, no fever. She is having normal BMs and is taking her Omeprazole-40mg  daily. Wants to know what to do to get relief.

## 2019-01-04 ENCOUNTER — Encounter: Payer: Self-pay | Admitting: Gastroenterology

## 2019-01-13 ENCOUNTER — Telehealth: Payer: Self-pay | Admitting: Gastroenterology

## 2019-01-13 MED ORDER — HYDROCORTISONE ACETATE 25 MG RE SUPP: 25.0000 mg | Freq: Two times a day (BID) | RECTAL | 1 refills | Status: DC

## 2019-01-13 NOTE — Telephone Encounter (Signed)
Colonoscopy was done on 10/7 for the purpose of rectal bleeding. A few small polyps removed, I think her bleeding is very unlikely to be related to that. She had internal hemorrhoids as the cause of her symptoms and I think driving her current symptoms. We can try some anusol 25mg  PR daily for the next 1-2 weeks and see if that helps. She may ultimately warrant hemorrhoid banding and can follow up with me in the clinic if she wishes to have more definitive therapy for this. Thanks

## 2019-01-13 NOTE — Telephone Encounter (Signed)
The pt has had 2 bloody stools since colon on 10/7.  Very light pink and no other symptoms.  Please advise

## 2019-01-13 NOTE — Telephone Encounter (Signed)
Spoke with pt and she is aware. Script sent to pharmacy. 

## 2019-01-13 NOTE — Telephone Encounter (Signed)
Pt's husband states that pt has been having episodes of blood in her stool, they would like some advise.

## 2019-04-27 ENCOUNTER — Other Ambulatory Visit: Payer: Self-pay | Admitting: Family Medicine

## 2019-04-27 DIAGNOSIS — N644 Mastodynia: Secondary | ICD-10-CM

## 2019-05-26 ENCOUNTER — Ambulatory Visit
Admission: RE | Admit: 2019-05-26 | Discharge: 2019-05-26 | Disposition: A | Discharge status: Home / Self Care | Payer: Medicare HMO | Source: Ambulatory Visit | Attending: Family Medicine | Admitting: Family Medicine

## 2019-05-26 ENCOUNTER — Ambulatory Visit: Payer: Medicare HMO

## 2019-05-26 ENCOUNTER — Other Ambulatory Visit: Payer: Self-pay

## 2019-05-26 DIAGNOSIS — N644 Mastodynia: Secondary | ICD-10-CM

## 2019-08-03 ENCOUNTER — Ambulatory Visit: Payer: Medicare HMO | Admitting: Neurology

## 2019-09-20 ENCOUNTER — Other Ambulatory Visit: Payer: Self-pay | Admitting: Neurology

## 2019-09-20 ENCOUNTER — Ambulatory Visit: Payer: Medicare HMO | Admitting: Neurology

## 2019-09-20 ENCOUNTER — Encounter: Payer: Self-pay | Admitting: Neurology

## 2019-09-20 VITALS — BP 122/68 | HR 77 | Ht 64.0 in | Wt 161.0 lb

## 2019-09-20 DIAGNOSIS — F0281 Dementia in other diseases classified elsewhere with behavioral disturbance: Secondary | ICD-10-CM | POA: Diagnosis not present

## 2019-09-20 DIAGNOSIS — G308 Other Alzheimer's disease: Secondary | ICD-10-CM | POA: Diagnosis not present

## 2019-09-20 DIAGNOSIS — F02818 Dementia in other diseases classified elsewhere, unspecified severity, with other behavioral disturbance: Secondary | ICD-10-CM

## 2019-09-20 DIAGNOSIS — G309 Alzheimer's disease, unspecified: Secondary | ICD-10-CM

## 2019-09-20 MED ORDER — RIVASTIGMINE 9.5 MG/24HR TD PT24: 9.5000 mg | MEDICATED_PATCH | Freq: Every day | TRANSDERMAL | 12 refills | Status: DC

## 2019-09-25 ENCOUNTER — Encounter: Payer: Self-pay | Admitting: Neurology

## 2019-09-25 DIAGNOSIS — F0280 Dementia in other diseases classified elsewhere without behavioral disturbance: Secondary | ICD-10-CM | POA: Insufficient documentation

## 2019-09-25 NOTE — Patient Instructions (Signed)
Memory disorders-  Disease Caregiver Guide  Alzheimer disease causes a person to lose the ability to remember things and make decisions. A person who has Alzheimer disease may not be able to take care of himself or herself. He or she may need help with simple tasks. The tips below can help you care for the person. What kind of changes does this condition cause? This condition makes a person:  Forget things.  Feel confused.  Act differently.  Have different moods. These things get worse with time. Tips to help with symptoms  Be calm and patient.  Respond with a simple, short answer.  Avoid correcting the person in a negative way.  Try not to take things personally, even if the person forgets your name.  Do not argue with the person. This may make the person more upset. Tips to lessen frustration  Make appointments and do daily tasks when the person is at his or her best.  Take your time. Simple tasks may take longer. Allow plenty of time to complete tasks.  Limit choices for the person.  Involve the person in what you are doing.  Keep a daily routine.  Avoid new or crowded places, if possible.  Use simple words, short sentences, and a calm voice. Only give one direction at a time.  Buy clothes and shoes that are easy to put on and take off.  Organize medicines in a pillbox for each day of the week.  Keep a calendar in a central location to remind the person of meetings or other activities.  Let people help if they offer. Take a break when needed. Tips to prevent injury  Keep floors clear. Remove rugs, magazine racks, and floor lamps.  Keep hallways well-lit.  Put a handrail and non-slip mat in the bathtub or shower.  Put childproof locks on cabinets that have dangerous items in them. These items include medicine, alcohol, guns, toxic cleaning items, sharp tools, matches, and lighters.  Put locks on doors where the person cannot see or reach them. This helps  the person to not wander out of the house and get lost.  Be prepared for emergencies. Keep a list of emergency phone numbers and addresses close by.  Bracelets may be worn that track location and identify the person as having memory problems. This should be worn at all times for safety. Tips for the future  Discuss financial and legal planning early. People with this disease have trouble managing their money as the disease gets worse. Get help from a professional.  Talk about advance directives, safety, and daily care. Take these steps: ? Create a living will and choose a power of attorney. This is someone who can make decisions for the person with Alzheimer disease when he or she can no longer do so. ? Discuss driving safety and when to stop driving. The person's doctor can help with this. ? If the person lives alone, make sure he or she is safe. Some people need extra help at home. Other people need more care at a nursing home or care center. Where to find support You can find support by joining a support group near you. Some benefits of joining a support group include:  Learning ways to manage stress.  Sharing experiences with others.  Getting emotional comfort and support.  Learning about caregiving as the disease progresses.  Knowing what community resources are available and making use of them. Where to find more information  Alzheimer[employer]: [employer] Contact a  doctor if:  The person has a fever.  The person has a sudden behavior change that does not get better with calming strategies.  The person is not able to take care of himself or herself at home.  The person threatens you or anyone else, including himself or herself.  You are no longer able to care for the person. Summary  Alzheimer disease causes a person to forget things and to be confused.  A person who has this condition may not be able to take care of himself or herself.  Take steps to keep the  person from getting hurt. Plan for future care.  You can find support by joining a support group near you. This information is not intended to replace advice given to you by your health care provider. Make sure you discuss any questions you have with your health care provider. Document Revised: 06/28/2018 Document Reviewed: 03/04/2017 Elsevier Patient Education  2020 [employer].

## 2019-09-25 NOTE — Progress Notes (Signed)
PATIENT: [name] DOB: [birth year]  REASON FOR VISIT: follow up- memory HISTORY FROM: patient    HISTORY OF PRESENT ILLNESS:   HISTORY 01/08/16 Copied from Dr. Edwena Felty notes: 01-08-2016 , I had the pleasure of seeing Mr. and Mrs. Lokey here today, both had an excellent holiday in [place] on cruise. We are meeting today to also reevaluate the cognitive status of Mrs. Delude.  She still has some difficulties with attention, distractibility and appears anxious than ever in the test situation. 22-30, but loss of the recall words and the serial 7.  Patient has seen Dr Arfeen,psychiatrist.  Her mood changes and outbreaks have been frequent; impulse control is poor.  Starting Namenda.   07/28/17 [name] is a 74 year old female with a history of memory disturbance. She returns today for follow-up. The patient feels that her memory has remained stable. She is able to complete all ADLs independently. She lives at home with her husband. He knows that she does have trouble with short-term memory. He completes all the finances and has always done this. He reports that he does have to help her with organization. He does remind her of appointments. The patient manages her own medications however her husband oversee this. The patient states that she was on Aricept however it caused her many side effects including not being able to sleep at night. She no longer takes this medication. She returns today for an evaluation.   07-28-2017, [name] is seen for memory decline.  She performed today on the Mini-Mental Status Examination 21 out of 30 points and her main problem remains short-term memory. She is less anxious. Spend the winter in [place] enjoyed a her social circle - on Zoloft.  She attributes her better mood to Yoga.4- 5 times a week. Plays Tennis. Not on Namenda- never started.  12-22-2018, Patient and husband here for regular memory check up, MMSE 22-30.  Returned from [place]  will over winter be there again.  The couple takes walks, and reduced their social outings. Tinnitus bilateral got worse, constant and  Non- pulsatile. ENT had nothing to offer, either. She is fluently speaking.        REVIEW OF SYSTEMS: Out of a complete 14 system review of symptoms, the patient complains only of the following symptoms, and all other reviewed systems are negative.   Ringing in ears, decreased concentration, much less depression, and less nervous/anxious,  stabilized memory loss  Tinnitus bilateral got worse, constant and  Non- pulsatile. ENT had nothing to offer, either.   ALLERGIES: Allergies  Allergen Reactions  . Ibuprofen Nausea And Vomiting    HOME MEDICATIONS: Outpatient Medications Prior to Visit  Medication Sig Dispense Refill  . Multiple Vitamin (MULTIVITAMIN) tablet Take 1 tablet by mouth daily.    Marland Kitchen omeprazole (PRILOSEC) 20 MG capsule Take 1 capsule (20 mg total) by mouth daily. 90 capsule 3  . rivastigmine (EXELON) 4.6 mg/24hr Place 1 patch (4.6 mg total) onto the skin daily. 30 patch 12  . sertraline (ZOLOFT) 50 MG tablet Take 50 mg by mouth daily.     . hydrocortisone (ANUSOL-HC) 2.5 % rectal cream Place 1 application rectally 2 (two) times daily. Inside rectum twice daily for 10 days. 30 g 0  . hydrocortisone (ANUSOL-HC) 25 MG suppository Place 1 suppository (25 mg total) rectally every 12 (twelve) hours. 12 suppository 1   No facility-administered medications prior to visit.    PAST MEDICAL HISTORY: Past Medical History:  Diagnosis Date  .  Anxiety   . Depression   . GERD (gastroesophageal reflux disease)   . Tinnitus     PAST SURGICAL HISTORY: Past Surgical History:  Procedure Laterality Date  . ABDOMINAL HYSTERECTOMY    . APPENDECTOMY    . CERVICAL SPINE SURGERY    . knee surgery Right   . MOUTH SURGERY     roof of mouth  . TONSILLECTOMY    . WISDOM TOOTH EXTRACTION      FAMILY HISTORY: Family History  Problem Relation Age  of Onset  . Colon cancer Neg Hx   . Breast cancer Neg Hx   . Rectal cancer Neg Hx   . Stomach cancer Neg Hx   . Esophageal cancer Neg Hx     SOCIAL HISTORY: Social History   Socioeconomic History  . Marital status: Married    Spouse name: Collier Salina  . Number of children: 2  . Years of education: hs  . Highest education level: Not on file  Occupational History  . Occupation: Retired  Tobacco Use  . Smoking status: Never Smoker  . Smokeless tobacco: Never Used  Vaping Use  . Vaping Use: Never used  Substance and Sexual Activity  . Alcohol use: Yes    Alcohol/week: 0.0 standard drinks    Comment: occ (glass of wine)  . Drug use: No  . Sexual activity: Yes    Birth control/protection: None  Other Topics Concern  . Not on file  Social History Narrative   Right handed.   Caffeine 3 cups daily.  Married, 2 kids.  College.  Retired.     Social Determinants of Health   Financial Resource Strain:   . Difficulty of Paying Living Expenses:   Food Insecurity:   . Worried About Charity fundraiser in the Last Year:   . Arboriculturist in the Last Year:   Transportation Needs:   . Film/video editor (Medical):   Marland Kitchen Lack of Transportation (Non-Medical):   Physical Activity:   . Days of Exercise per Week:   . Minutes of Exercise per Session:   Stress:   . Feeling of Stress :   Social Connections:   . Frequency of Communication with Friends and Family:   . Frequency of Social Gatherings with Friends and Family:   . Attends Religious Services:   . Active Member of Clubs or Organizations:   . Attends Archivist Meetings:   Marland Kitchen Marital Status:   Intimate Partner Violence:   . Fear of Current or Ex-Partner:   . Emotionally Abused:   Marland Kitchen Physically Abused:   . Sexually Abused:       PHYSICAL EXAM  Vitals:   09/20/19 1508  BP: 122/68  Pulse: 77  Weight: 161 lb (73 kg)  Height: 5\' 4"  (1.626 m)   Body mass index is 27.64 kg/m.   MMSE - Mini Mental State Exam  09/20/2019 12/22/2018 07/28/2017  Orientation to time 0 2 3  Orientation to Place 4 5 5   Registration 3 3 3   Attention/ Calculation 1 5 1   Attention/Calculation-comments - - -  Recall 0 0 0  Language- name 2 objects 2 2 2   Language- repeat 1 1 1   Language- follow 3 step command 3 3 3   Language- read & follow direction 1 0 1  Write a sentence 1 1 1   Copy design 0 0 1  Total score 16 22 21      Generalized: Well developed, in no acute distress , well  groomed.   Neurological examination  Mentation: Alert oriented to time, place, history taking. Follows all commands speech and language fluent.  Cranial nerve :  Taste and smell reportedly intact. Pupils were equal in size, round, and remain promptly reactive to light.  Extraocular movements were full, no nystagmus, beginning cataract on the left eye?   her visual fields were full on finger perimetry /confrontational test.  Facial sensation and strength were normal. Uvula and tongue are midline. Head turning and shoulder shrug were symmetric. Motor:  . symmetric motor tone is noted throughout. No rigor, no cog- wheeling.  Sensory: intact to vibration and soft touch on all 4 extremities. No evidence of extinction is noted.  Coordination: intactfinger-nose- bilaterally.  Reflexes:  symmetricbilaterally.   DIAGNOSTIC DATA (LABS, IMAGING, TESTING) - I reviewed patient records, labs, notes, testing and imaging myself where available.  We discussed with Mr. Klosowski that the current result of a basic Memory test ( MMSE) is compatible with dementia, not with MCI. The anxiety , the mood changes, behaviors have been persistent -also the patient is less tearful on Zoloft. As she lacks any fronto temporal or Lewy body signs, this dementia is most like of Alzheimer's type.   MRI had been done in 2015, and it may be best to repeat imaging as dementia scan with a search of beta amyloid/  Fibrilles. Mrs. Guevarra son asked about a brain volumetric  measurement- that is part of imaging.    ASSESSMENT AND PLAN :   74 y.o. year old female  has a medical history of Anxiety, Depression, GERD (gastroesophageal reflux disease), and Tinnitus.  She spends her winters in [place] and summer here in [place]. She follows here for Alzheimer's type dementia, MMSE ( not MOCA)  no longer fitting for MCI diagnosis. She is often anxious and feels worse in testing situations. She is sleeping more now, and her husband has to wake her more than once to get her going.   He estimated 12 hours of sleep a day- also misplaces a lot of things, lost keys, mobile phone, and even noteblock.     1. Dementia- Memory disturbance, worse affected is short e term memory, at time very frustrating. She had stabilized by MMSE at 22/ 30 points until late 2019, now dropped many points- 16/30 points- this is progressive. Not in [place] . Uses excelon patch on low dose which we will increase now.   2. Anxiety and depression - initially much better on Zoloft, well tolerated ( 2019) . Dr Doreene Nest increased the dose after hot flashes. She experienced these in day and night tim. Now participates in Yoga, less frequently in Tennis, but is getting lost much more often.    3.We will continue to monitor the memory disorder. It is most likely Alzheimer's type Dementia. last neuroimaging was in 2015- 5.5 years ago and is due to be repeated.   Not tolerating Namenda due to Gi effects. Same with Aricept, but could tolerate excelon starter dose- . Increased today dose of  Excelon - patches to 9 mg.    We had to finish our visit in the midst of a power outage- no AVS could be given to this patient.    I have added an amyloid scan of the brain for diagnostic certainty.   Asencion Partridge Romayne Ticas  09/25/2019, 12:50 PM [facility] [address] [place], [place] [address] [phone]

## 2019-09-28 ENCOUNTER — Telehealth: Payer: Self-pay | Admitting: Neurology

## 2019-09-28 NOTE — Telephone Encounter (Signed)
Pt's husband called in to discuss if the patient should be driving. He does not feel like she should. I advised that Dr Brett Fairy would agree with her not driving. I also advised the patient that Dr Brett Fairy was reviewing her images that were completed in 2015. Dr Dohmeier wants to do an Alzheimer PET scan - before the couple goes back to [facility].  Advised the husband that she has placed that order and someone should be contacting to get her scheduled. Her verbalized understanding. Advised once that testing is completed then we will call with results. He was appreciative.

## 2019-10-13 ENCOUNTER — Ambulatory Visit (HOSPITAL_COMMUNITY): Payer: Medicare HMO

## 2019-10-25 ENCOUNTER — Encounter (HOSPITAL_COMMUNITY)
Admission: RE | Admit: 2019-10-25 | Discharge: 2019-10-25 | Disposition: A | Discharge status: Home / Self Care | Payer: Medicare HMO | Source: Ambulatory Visit | Attending: Neurology | Admitting: Neurology

## 2019-10-25 ENCOUNTER — Other Ambulatory Visit: Payer: Self-pay

## 2019-10-25 DIAGNOSIS — G309 Alzheimer's disease, unspecified: Secondary | ICD-10-CM | POA: Diagnosis present

## 2019-10-25 LAB — GLUCOSE, CAPILLARY: Glucose-Capillary: 111 mg/dL — ABNORMAL HIGH (ref 70–99)

## 2019-10-25 MED ORDER — FLUDEOXYGLUCOSE F - 18 (FDG) INJECTION
9.5000 | Freq: Once | INTRAVENOUS | Status: AC | PRN
  Administered 2019-10-25: 9.5 via INTRAVENOUS

## 2019-10-25 NOTE — Progress Notes (Signed)
Consistent with Alzheimer's pathology.

## 2019-10-25 NOTE — Progress Notes (Signed)
Elevated capillary glucose, need to correlate to HBa1c.

## 2019-10-26 NOTE — Progress Notes (Signed)
Consistent finding with Alzheimer's disorder

## 2019-11-01 ENCOUNTER — Telehealth: Payer: Self-pay | Admitting: Neurology

## 2019-11-01 ENCOUNTER — Encounter: Payer: Self-pay | Admitting: Neurology

## 2019-11-01 NOTE — Telephone Encounter (Signed)
-----   Message from Larey Seat, MD sent at 10/26/2019  2:05 PM EDT ----- Consistent finding with Alzheimer's disorder

## 2019-11-01 NOTE — Telephone Encounter (Signed)
Called the patient's husband. There was no answer and no VM set up. I will send a mychart message to the patient.   **If husband calls back, please advise that Dr Brett Fairy reviewed the results of the PER scan which was indicative of alzheimers disorder.  (This is not a new diagnosis, it just confirmed diagnosis)

## 2019-11-09 ENCOUNTER — Other Ambulatory Visit: Payer: Self-pay | Admitting: Family Medicine

## 2019-11-09 DIAGNOSIS — Z1231 Encounter for screening mammogram for malignant neoplasm of breast: Secondary | ICD-10-CM

## 2019-12-06 ENCOUNTER — Other Ambulatory Visit: Payer: Self-pay | Admitting: Gastroenterology

## 2019-12-21 ENCOUNTER — Ambulatory Visit
Admission: RE | Admit: 2019-12-21 | Discharge: 2019-12-21 | Disposition: A | Discharge status: Home / Self Care | Payer: Medicare HMO | Source: Ambulatory Visit | Attending: Family Medicine | Admitting: Family Medicine

## 2019-12-21 ENCOUNTER — Other Ambulatory Visit: Payer: Self-pay

## 2019-12-21 DIAGNOSIS — Z1231 Encounter for screening mammogram for malignant neoplasm of breast: Secondary | ICD-10-CM

## 2019-12-26 ENCOUNTER — Ambulatory Visit: Payer: Medicare HMO | Attending: Internal Medicine

## 2019-12-26 DIAGNOSIS — Z23 Encounter for immunization: Secondary | ICD-10-CM

## 2019-12-26 NOTE — Progress Notes (Signed)
° °  Covid-19 Vaccination Clinic  Name:  [name]    MRN: [id number] DOB: [birth year]  12/26/2019  [name] was observed post Covid-19 immunization for 15 minutes without incident. She was provided with Vaccine Information Sheet and instruction to access the V-Safe system.   [name] was instructed to call 911 with any severe reactions post vaccine:  Difficulty breathing   Swelling of face and throat   A fast heartbeat   A bad rash all over body   Dizziness and weakness

## 2020-01-29 ENCOUNTER — Telehealth: Payer: Self-pay | Admitting: Neurology

## 2020-01-29 NOTE — Telephone Encounter (Signed)
Dresden,Peter(husband on DPR) is asking for a call from RN to discuss changes in pt's health.  Pt's husband declined bringing pt in for a f/u just yet.  Pt asking for a phone call back 1st, please call.

## 2020-01-29 NOTE — Telephone Encounter (Signed)
Called the husband back to discuss her concerns. He states that his mother in law had come in town and stayed for a month. The pt and her have never gotten along. She became very irate at times, would be yelling and getting into it with the 72 yr old mother in law. Then would become hostile towards him. She would throw things and was difficult to calm.  The mother in law has left and been gone for 2 days and the intense level of her anger has gotten better but the husband states that she increased in her sleepiness day and night. He feels she has built up anger in her, barely speaking to him.  I advised that whenever a routine is disturbed with patients who have dementia it can cause a increase in this particular behavior and decline in mental cognition. Advised that hopefully with the mother in law leaving he will notice that she will begin to feel in normal environment. I confirmed that the patient nor the husband was in danger and that he didn't feel threatened. He states that at this time she is calmer but still withdrawn.  Advised I would discuss all this with Dr Brett Fairy and see what she thinks and if she agrees with me. Informed if she has anything else to add I will call back with her recommendations. Advised that if he doesn't hear from me than he would need to continue to watch her and call if she doesn't seem to get back to her baseline. Pt's husband verbalized understanding.

## 2020-02-07 MED ORDER — QUETIAPINE FUMARATE 25 MG PO TABS: 25.0000 mg | ORAL_TABLET | Freq: Every day | ORAL | 1 refills | Status: DC

## 2020-02-07 NOTE — Addendum Note (Signed)
Addended by: Larey Seat on: 02/07/2020 04:50 PM   Modules accepted: Orders

## 2020-02-07 NOTE — Telephone Encounter (Signed)
Pt's husband Colena Ketterman (on [place]) called, she believe she has misplaced a diamond ring and accusing everyone of stealing it. She is hysterical, I can't get her calmed down. Can you prescribe something or recommend on what to do? Would like a call from the nurse.

## 2020-02-07 NOTE — Telephone Encounter (Signed)
Seroquel. I will write for 25 mg to be taken at late afternoon,  If the patient has a pill cutter , we may be able to use even half of that dose.  At [facility] in [place].  Larey Seat, MD

## 2020-02-07 NOTE — Telephone Encounter (Signed)
Called the patient to advise and there was no answer. Left a message without going into too much detail to advise a [facility] was [facility]. Advised the patient's husband to reach back out with questions.

## 2020-02-08 NOTE — Telephone Encounter (Signed)
Called the patient's husband and I was able to review his concerns. He states that she just becomes hysterical  And has outburst. As of this moment she is calm and plans to go walking with friends at 4 pm. I advised we had a opening on Monday. Encouraged them to be here at 1 for a 1:30 pm.  Pt's husband verbalized understanding.

## 2020-02-08 NOTE — Telephone Encounter (Signed)
Pt's husband, Tzipora Mcinroy called, would like to discuss her mental health. Can the nurse work her in before December 1. We will be out of town after December 1. Would like a call from the nurse. Please contact me on my cellphone: [phone].

## 2020-02-12 ENCOUNTER — Encounter: Payer: Self-pay | Admitting: Neurology

## 2020-02-12 ENCOUNTER — Ambulatory Visit: Payer: Medicare HMO | Admitting: Neurology

## 2020-02-12 ENCOUNTER — Other Ambulatory Visit: Payer: Self-pay

## 2020-02-12 VITALS — BP 128/77 | HR 69 | Ht 64.0 in | Wt 154.0 lb

## 2020-02-12 DIAGNOSIS — F0281 Dementia in other diseases classified elsewhere with behavioral disturbance: Secondary | ICD-10-CM

## 2020-02-12 DIAGNOSIS — G301 Alzheimer's disease with late onset: Secondary | ICD-10-CM

## 2020-02-12 DIAGNOSIS — F331 Major depressive disorder, recurrent, moderate: Secondary | ICD-10-CM

## 2020-02-12 DIAGNOSIS — F0280 Dementia in other diseases classified elsewhere without behavioral disturbance: Secondary | ICD-10-CM

## 2020-02-12 DIAGNOSIS — F02818 Dementia in other diseases classified elsewhere, unspecified severity, with other behavioral disturbance: Secondary | ICD-10-CM

## 2020-02-12 MED ORDER — QUETIAPINE FUMARATE 25 MG PO TABS: 25.0000 mg | ORAL_TABLET | Freq: Every day | ORAL | 1 refills | Status: DC

## 2020-02-12 MED ORDER — RIVASTIGMINE 9.5 MG/24HR TD PT24: 9.5000 mg | MEDICATED_PATCH | Freq: Every day | TRANSDERMAL | 12 refills | Status: DC

## 2020-02-12 NOTE — Patient Instructions (Signed)
Quetiapine tablets What is this medicine? QUETIAPINE (kwe TYE a peen) is an antipsychotic. It is used to treat schizophrenia and bipolar disorder, also known as manic-depression. This medicine may be used for other purposes; ask your health care provider or pharmacist if you have questions. COMMON BRAND NAME(S): Seroquel What should I tell my health care provider before I take this medicine? They need to know if you have any of these conditions:  blockage in your bowel  cataracts  constipation  dehydration  diabetes  difficulty swallowing  glaucoma  heart disease  history of breast cancer  kidney disease  liver disease  low blood counts, like low white cell, platelet, or red cell counts  low blood pressure or dizziness when standing up  Parkinson's disease  previous heart attack  prostate disease  seizures  stomach or intestine problems  suicidal thoughts, plans or attempt; a previous suicide attempt by you or a family member  thyroid disease  trouble passing urine  an unusual or allergic reaction to quetiapine, other medicines, foods, dyes, or preservatives  pregnant or trying to get pregnant  breast-feeding How should I use this medicine? Take this medicine by mouth. Swallow it with a drink of water. Follow the directions on the prescription label. If it upsets your stomach you can take it with food. Take your medicine at regular intervals. Do not take it more often than directed. Do not stop taking except on the advice of your doctor or health care professional. A special MedGuide will be given to you by the pharmacist with each prescription and refill. Be sure to read this information carefully each time. Talk to your pediatrician regarding the use of this medicine in children. While this drug may be prescribed for children as young as 10 years for selected conditions, precautions do apply. Patients over age 65 years may have a stronger reaction to this  medicine and need smaller doses. Overdosage: If you think you have taken too much of this medicine contact a poison control center or emergency room at once. NOTE: This medicine is only for you. Do not share this medicine with others. What if I miss a dose? If you miss a dose, take it as soon as you can. If it is almost time for your next dose, take only that dose. Do not take double or extra doses. What may interact with this medicine? Do not take this medicine with any of the following medications:  cisapride  dronedarone  fluconazole  metoclopramide  pimozide  posaconazole  thioridazine This medicine may also interact with the following medications:  alcohol  antihistamines for allergy cough and cold  antiviral medicines for HIV or AIDS  atropine  certain medicines for bladder problems like oxybutynin, tolterodine  certain medicines for blood pressure  certain medicines for depression, anxiety, or psychotic disturbances  certain medicines for diabetes  certain medicines for stomach problems like dicyclomine, hyoscyamine  certain medicines for travel sickness like scopolamine  certain medicines for Parkinson's disease  certain medicines for seizures like carbamazepine, phenobarbital, phenytoin  cimetidine  erythromycin  ipratropium  other medicines that prolong the QT interval (cause an abnormal heart rhythm) like dofetilide  rifampin  steroid medicines like prednisone or cortisone This list may not describe all possible interactions. Give your health care provider a list of all the medicines, herbs, non-prescription drugs, or dietary supplements you use. Also tell them if you smoke, drink alcohol, or use illegal drugs. Some items may interact with your medicine. What   should I watch for while using this medicine? Visit your health care professional for regular checks on your progress. Tell your health care professional if symptoms do not start to get  better or if they get worse. Do not stop taking except on your health care professional's advice. You may develop a severe reaction. Your health care professional will tell you how much medicine to take. You may need to have an eye exam before and during use of this medicine. This medicine may increase blood sugar. Ask your health care provider if changes in diet or medicines are needed if you have diabetes. Patients and their families should watch out for new or worsening depression or thoughts of suicide. Also watch out for sudden or severe changes in feelings such as feeling anxious, agitated, panicky, irritable, hostile, aggressive, impulsive, severely restless, overly excited and hyperactive, or not being able to sleep. If this happens, especially at the beginning of antidepressant treatment or after a change in dose, call your health care professional. You may get dizzy or drowsy. Do not drive, use machinery, or do anything that needs mental alertness until you know how this medicine affects you. Do not stand or sit up quickly, especially if you are an older patient. This reduces the risk of dizzy or fainting spells. Alcohol may interfere with the effect of this medicine. Avoid alcoholic drinks. This drug can cause problems with controlling your body temperature. It can lower the response of your body to cold temperatures. If possible, stay indoors during cold weather. If you must go outdoors, wear warm clothes. It can also lower the response of your body to heat. Do not overheat. Do not over-exercise. Stay out of the sun when possible. If you must be in the sun, wear cool clothing. Drink plenty of water. If you have trouble controlling your body temperature, call your health care provider right away. What side effects may I notice from receiving this medicine? Side effects that you should report to your doctor or health care professional as soon as possible:  allergic reactions like skin rash,  itching or hives, swelling of the face, lips, or tongue  breathing problems  changes in vision  confusion  elevated mood, decreased need for sleep, racing thoughts, impulsive behavior  eye pain  fast, irregular heartbeat  fever or chills, sore throat  inability to keep still  males: prolonged or painful erection  problems with balance, talking, walking  redness, blistering, peeling, or loosening of the skin, including inside the mouth  seizures  signs and symptoms of high blood sugar such as being more thirsty or hungry or having to urinate more than normal. You may also feel very tired or have blurry vision  signs and symptoms of hypothyroidism like fatigue; increased sensitivity to cold; weight gain; hoarseness; thinning hair  signs and symptoms of low blood pressure like dizziness; feeling faint or lightheaded; falls; unusually weak or tired  signs and symptoms of neuroleptic malignant syndrome like confusion; fast, irregular heartbeat; high fever; increased sweating; stiff muscles  sudden numbness or weakness of the face, arm, or leg  suicidal thoughts or other mood changes  trouble swallowing  uncontrollable movements of the arms, face, head, mouth, neck, or upper body Side effects that usually do not require medical attention (report to your doctor or health care professional if they continue or are bothersome):  change in sex drive or performance  constipation  drowsiness  dry mouth  upset stomach  weight gain This list may   not describe all possible side effects. Call your doctor for medical advice about side effects. You may report side effects to FDA at 1-800-FDA-1088. Where should I keep my medicine? Keep out of the reach of children. Store at room temperature between 15 and 30 degrees C (59 and 86 degrees F). Throw away any unused medicine after the expiration date. NOTE: This sheet is a summary. It may not cover all possible information. If you  have questions about this medicine, talk to your doctor, pharmacist, or health care provider.  2020 Elsevier/Gold Standard (2019-01-04 11:59:11)  
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
## 2020-02-26 ENCOUNTER — Telehealth: Payer: Self-pay | Admitting: Gastroenterology

## 2020-02-26 ENCOUNTER — Other Ambulatory Visit: Payer: Self-pay | Admitting: Gastroenterology

## 2020-02-26 NOTE — Telephone Encounter (Signed)
Pt's spouse is requesting a 90 day supply of the Omeprazole instead of the 30.

## 2020-03-14 ENCOUNTER — Telehealth: Payer: Self-pay | Admitting: Gastroenterology

## 2020-03-14 NOTE — Telephone Encounter (Signed)
Left message to call back  

## 2020-03-18 ENCOUNTER — Other Ambulatory Visit: Payer: Self-pay

## 2020-03-18 MED ORDER — OMEPRAZOLE 20 MG PO CPDR: DELAYED_RELEASE_CAPSULE | ORAL | 0 refills | Status: DC

## 2020-03-18 NOTE — Telephone Encounter (Signed)
Discussed with husband she could take omeprazole otc. He states that is more expensive than the prescription. Let him know if he schedules an appt the med can be refilled. They will not be back from [place] until early April. Refilled script for 30 days, pts husband knows to call back to schedule appt when schedule out.

## 2020-03-18 NOTE — Telephone Encounter (Signed)
Inbound call from patient's husband returning your call.

## 2020-03-20 NOTE — Telephone Encounter (Signed)
Reminder in epic to call patient to schedule follow up in April.

## 2020-05-21 ENCOUNTER — Telehealth: Payer: Self-pay

## 2020-05-21 NOTE — Telephone Encounter (Signed)
Spoke with patient's husband to schedule routine follow up, he states that she may not be able to come in April, he states that they ,ay have to schedule in June. Patient's husband would like to call us back when they are ready to schedule, he thanked me for the follow up call and had no concerns at the end of the call.

## 2020-05-21 NOTE — Telephone Encounter (Signed)
-----   Message from Yevette Edwards, RN sent at 03/20/2020 12:41 PM EST ----- Regarding: Follow Up appt Patient needs a routine follow up visit, will return in April.

## 2020-06-03 ENCOUNTER — Other Ambulatory Visit: Payer: Self-pay | Admitting: Neurology

## 2020-06-03 MED ORDER — QUETIAPINE FUMARATE 25 MG PO TABS: 25.0000 mg | ORAL_TABLET | Freq: Every day | ORAL | 5 refills | Status: DC

## 2020-06-17 ENCOUNTER — Ambulatory Visit: Payer: Self-pay | Admitting: Adult Health

## 2020-06-18 ENCOUNTER — Telehealth: Payer: Self-pay | Admitting: Neurology

## 2020-06-18 NOTE — Telephone Encounter (Signed)
Pt's husband along with pt are in [facility]. Husband is asking for an email to be sent to him stating pt has memory issues and will not be able to comprehend what is asked for when they go for a state ID.  Husband states without something like this they([facility]) will not allow him to go in and speak on behalf of pt. Please call

## 2020-06-19 ENCOUNTER — Encounter: Payer: Self-pay | Admitting: Neurology

## 2020-06-19 NOTE — Telephone Encounter (Signed)
I have completed the letter for the patient and it has been sent through my chart.  I have also sent the patient and her husband a message on my chart informing that the letter has went through.  **If he calls back please make aware that the letter has been sent through my chart.  If it needs to be faxed or mailed we would need the information and if there is any changes needed to the letter, if he can provide what else is needed.

## 2020-07-01 ENCOUNTER — Ambulatory Visit: Payer: Self-pay | Admitting: Adult Health

## 2020-07-24 ENCOUNTER — Ambulatory Visit: Payer: Medicare HMO | Admitting: Gastroenterology

## 2020-07-25 ENCOUNTER — Ambulatory Visit: Payer: Medicare HMO | Admitting: Gastroenterology

## 2020-08-22 ENCOUNTER — Encounter: Payer: Self-pay | Admitting: Gastroenterology

## 2020-08-22 ENCOUNTER — Ambulatory Visit: Payer: Medicare HMO | Admitting: Gastroenterology

## 2020-08-22 VITALS — BP 162/80 | HR 65 | Ht 64.0 in | Wt 144.0 lb

## 2020-08-22 DIAGNOSIS — K648 Other hemorrhoids: Secondary | ICD-10-CM | POA: Diagnosis not present

## 2020-08-22 DIAGNOSIS — Z79899 Other long term (current) drug therapy: Secondary | ICD-10-CM | POA: Diagnosis not present

## 2020-08-22 DIAGNOSIS — K219 Gastro-esophageal reflux disease without esophagitis: Secondary | ICD-10-CM

## 2020-08-22 DIAGNOSIS — Z86010 Personal history of colonic polyps: Secondary | ICD-10-CM | POA: Diagnosis not present

## 2020-08-22 MED ORDER — OMEPRAZOLE 20 MG PO CPDR: 20.0000 mg | DELAYED_RELEASE_CAPSULE | Freq: Every day | ORAL | 3 refills | Status: DC

## 2020-08-22 NOTE — Progress Notes (Signed)
HPI :  75 year old female here for a follow-up visit for GERD and internal hemorrhoids.  She is accompanied by her husband today.  Recall that she had an EGD in May 2018.  At that time she had a 5 cm hiatal hernia, very mild LA grade a esophagitis with an inflammatory nodule.  Biopsy showed inflammatory changes only.  She had a diminutive gastric polyp which was biopsied and was hyperplastic with focal goblet cell metaplasia.   Previously she was on omeprazole 40 mg once daily.  At her last visit we discussed trying to reduce her dose.  She currently takes omeprazole 20 mg on most days but sometimes goes without it and takes every other day.  She is happy with the regimen, it is controlling her reflux symptoms.  No dysphagia or alarm symptoms.  She is eating okay.  She has tried coming off PPI in the past but has had recurrent symptoms so she has used it at baseline.  She denies any history of osteopenia or osteoporosis, no history of bone fractures.  She states she has normal kidney function.  Otherwise since her last visit she underwent a colonoscopy in October 2020 for recurrent rectal bleeding symptoms.  She was found to have moderate internal hemorrhoids as the source of her bleeding.  She otherwise had 4 small polyps removed, combination of sessile serrated polyps and adenomas on pathology.    EGD 07/2016 - 5cm HH, mild esophagitis, benign gastric hyperplastic polyp with focal goblet cell metaplasia  Colonoscopy 12/28/18 - The perianal and digital rectal examinations were normal. - The colon was tortuous. - A 5 mm polyp was found in the ascending colon. The polyp was sessile. The polyp was removed with a cold snare. Resection and retrieval were complete. - A 4 mm polyp was found in the hepatic flexure. The polyp was flat. The polyp was removed with a cold snare. Resection and retrieval were complete. - Two sessile polyps were found in the sigmoid colon. The polyps were 4 mm in size.  These polyps were removed with a cold snare. Resection and retrieval were complete. - A few small-mouthed diverticula were found in the sigmoid colon. - Internal hemorrhoids were found during retroflexion. The hemorrhoids were moderate. - The exam was otherwise without abnormality.  Surgical [P], colon, hepatic flexure and ascending and sigmoid-2, polyp (4) - SESSILE SERRATED POLYP WITHOUT CYTOLOGIC DYSPLASIA (X MULTIPLE FRAGMENTS). 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     Past Medical History:  Diagnosis Date  . Anxiety   . Colon polyps   . Depression   . GERD (gastroesophageal reflux disease)   . Hiatal hernia   . Internal hemorrhoids   . Tinnitus      Past Surgical History:  Procedure Laterality Date  . ABDOMINAL HYSTERECTOMY    . APPENDECTOMY    . CERVICAL SPINE SURGERY    . knee surgery Right   . MOUTH SURGERY     roof of mouth  . TONSILLECTOMY    . WISDOM TOOTH EXTRACTION     Family History  Problem Relation Age of Onset  . Colon cancer Neg Hx   . Breast cancer Neg Hx   . Rectal cancer Neg Hx   . Stomach cancer Neg Hx   . Esophageal cancer Neg Hx    Social History   Tobacco Use  . Smoking status: Never Smoker  . Smokeless tobacco: Never Used  Vaping Use  . Vaping Use: Never used  Substance Use Topics  . Alcohol use: Yes    Alcohol/week: 0.0 standard drinks    Comment: occ (glass of wine)  . Drug use: No   Current Outpatient Medications  Medication Sig Dispense Refill  . Multiple Vitamin (MULTIVITAMIN) tablet Take 1 tablet by mouth daily.    . QUEtiapine (SEROQUEL) 25 MG tablet Take 1 tablet (25 mg total) by mouth at bedtime. 30 tablet 5  . rivastigmine (EXELON) 9.5 mg/24hr Place 1 patch (9.5 mg total) onto the skin daily. 30 patch 12  . sertraline (ZOLOFT) 100 MG tablet Take 100 mg by mouth daily.    Marland Kitchen omeprazole (PRILOSEC) 20 MG capsule Take 1 capsule (20 mg total) by mouth daily. 90 capsule 3   No current  facility-administered medications for this visit.   Allergies  Allergen Reactions  . Ibuprofen Nausea And Vomiting     Review of Systems: All systems reviewed and negative except where noted in HPI.    Physical Exam: BP (!) 162/80 (BP Location: Left Arm, Patient Position: Sitting, Cuff Size: Normal)   Pulse 65   Ht 5\' 4"  (1.626 m)   Wt 144 lb (65.3 kg)   SpO2 99%   BMI 24.72 kg/m  Constitutional: Pleasant,well-developed, female in no acute distress. Neurological: Alert and oriented to person place and time. Psychiatric: Normal mood and affect. Behavior is normal.   ASSESSMENT AND PLAN: 75 year old female here for reassessment of the following issues:  GERD Long-term use of PPI therapy Internal hemorrhoids History of colon polyps  Doing well on low-dose use of omeprazole.  She has tried coming off this in the past with recurrent symptoms of bother her.  We have discussed the long-term risks and benefits of chronic PPIs, she understands and wishes to continue the regimen as it controls her symptoms well.  She will continue use the lowest dose needed to control symptoms.  We will refill her omeprazole today.  Her hemorrhoids are no longer bothering her in regards to bleeding symptoms, no intervention needed at this time.  She is due for a surveillance colonoscopy in October 2023.  Plan: - refilled omeprazole. Discussed long term risks / benefits, she understands and wishes to continue - colonoscopy 12/2021 - follow up as needed in the interim  Wink Cellar, MD [facility]

## 2020-09-05 ENCOUNTER — Telehealth: Payer: Self-pay | Admitting: Neurology

## 2020-09-05 NOTE — Telephone Encounter (Signed)
Called the patient's husband back. He states that she is just having moments of increased confusion, disorientation becomes defiant.  This is much more prominent with the husband. She is more normal and controlled with her friends. A friend came by today and that helped calm her and she was able to go out with the friend.  He states that it has become difficult by himself with her. He states that he has ativan that he will give her when she becomes increasingly agitated and may help for a little bit and the seroquel is used when needed and helps her sleep.   Pt is scheduled 09/18/2020 2:30 pm. Remind them to check in 2 pm for the memory testing. Advised that at that visit after seeing memory and evaluation we can discuss potentially getting a social work consult on board to help with resources for him and the pt. Pt's husband verbalized understanding and was appreciative for the call back

## 2020-09-05 NOTE — Telephone Encounter (Signed)
Pt's husband is asking for a call to discuss him being at the end of his rope, re: dealing with pt.  Husband states he has not control over pt, she will not eat.  Very defiant, pt hits and roams thru the house.  Husband is asking for a call as soon as possible.

## 2020-09-05 NOTE — Telephone Encounter (Signed)
Called the patient's husband back. There was no answer. LVM asking for the pt to call back.   Pt may need to be scheduled for a follow up visit if possible with NP or MD to discuss this decline. We need to repeat memory testing and evaluate need for social work getting involved to possibly help the pt.

## 2020-09-18 ENCOUNTER — Ambulatory Visit: Payer: Medicare HMO | Admitting: Adult Health

## 2020-09-18 VITALS — BP 130/72 | HR 72 | Ht 63.0 in | Wt 146.0 lb

## 2020-09-18 DIAGNOSIS — F0281 Dementia in other diseases classified elsewhere with behavioral disturbance: Secondary | ICD-10-CM | POA: Diagnosis not present

## 2020-09-18 DIAGNOSIS — G301 Alzheimer's disease with late onset: Secondary | ICD-10-CM

## 2020-09-18 IMAGING — PT NM PET TUM IMG INITIAL (PI) WHOLE BODY
2 series · 20 of 25 positions shown · non-contrast
Comparison: Brain MRI 01/22/2014

CLINICAL DATA: Dementia. Frontotemporal dementia versus Alzheimer's
type dementia

EXAM:
NM PET METABOLIC BRAIN
TECHNIQUE: 9.5 mCi F-18 FDG was injected intravenously. Full-ring PET imaging
was performed from the vertex to skull base. CT data was obtained
and used for attenuation correction and anatomic localization.
FASTING BLOOD GLUCOSE:  Value: 111 mg/dl

[Series 1071: mpr 5mm range · 0.47mm/px · 7 of 27 slices shown]
[im 1/27]
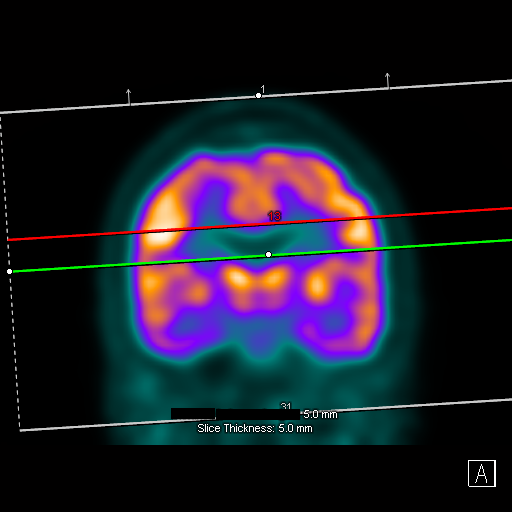
[im 4/27]
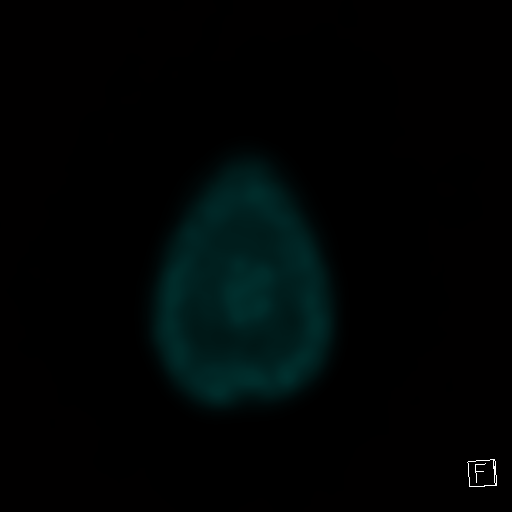
[im 10/27]
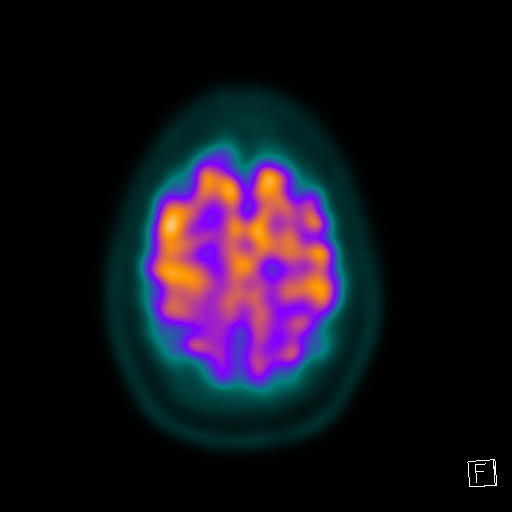
[im 14/27]
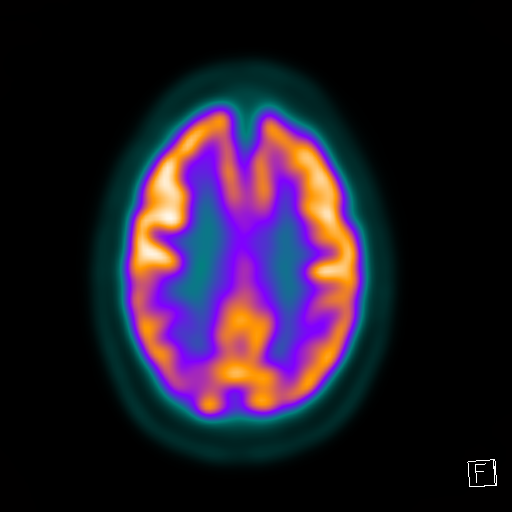
[im 17/27]
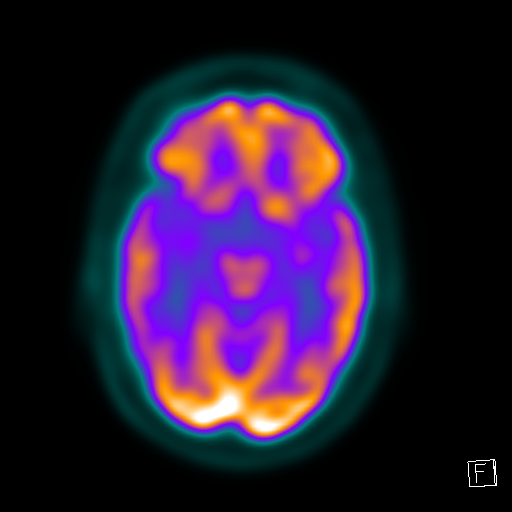
[im 20/27]
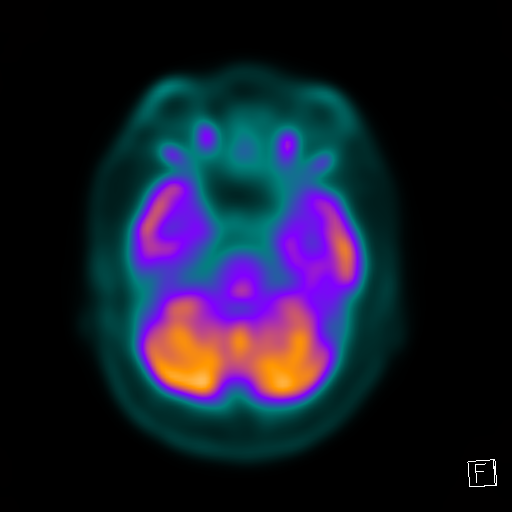
[im 27/27]
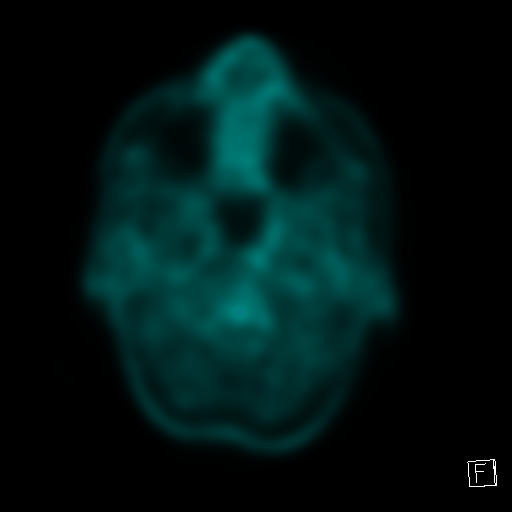

[Series 1075: mpr 3mm range · 0.62mm/px · 13 of 52 slices shown]
[im 1/52]
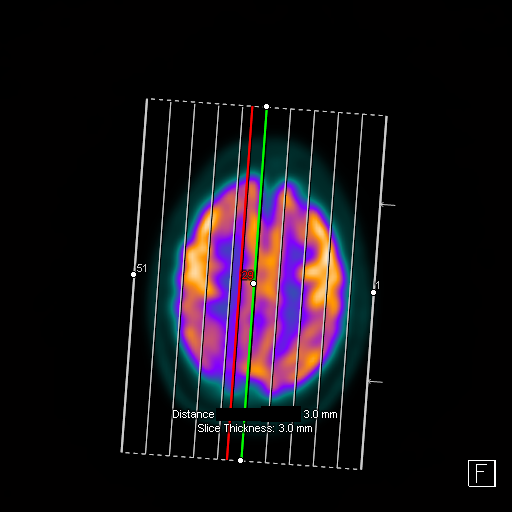
[im 4/52]
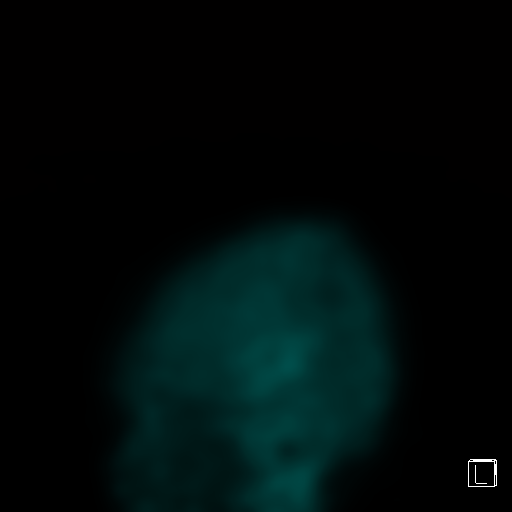
[im 7/52]
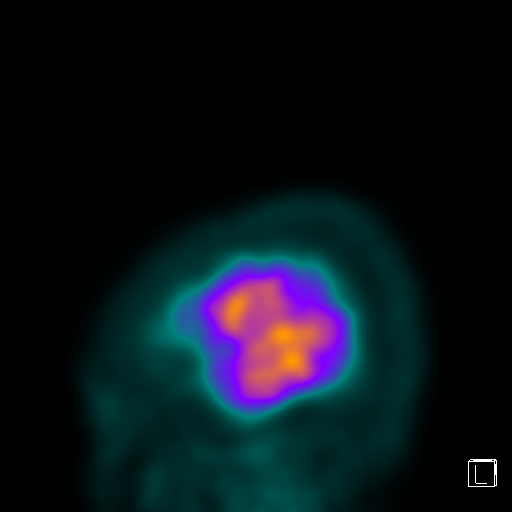
[im 14/52]
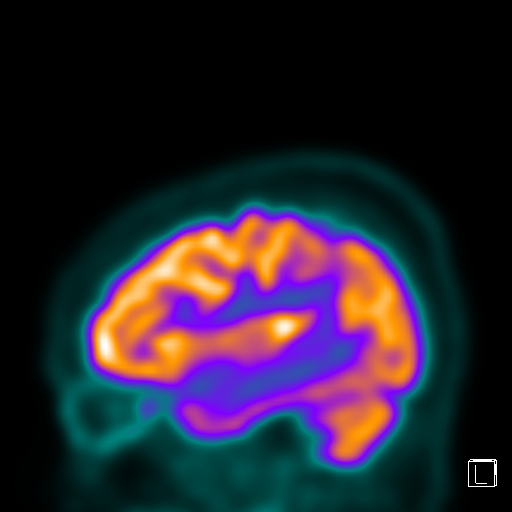
[im 18/52]
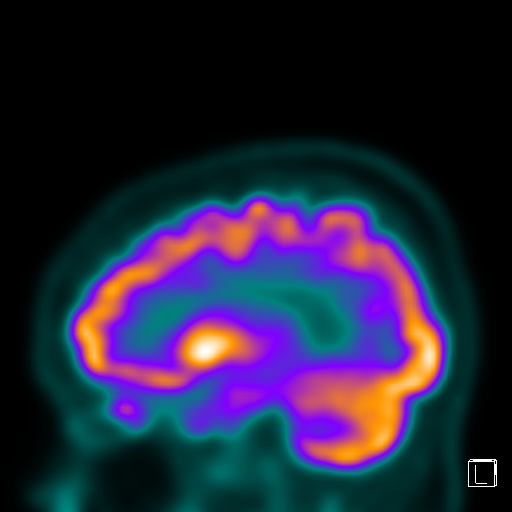
[im 21/52]
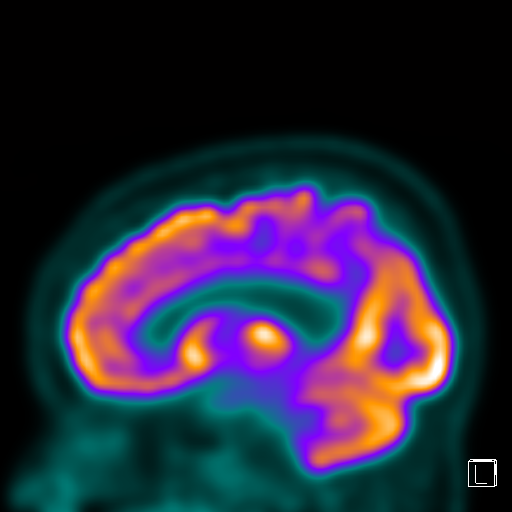
[im 24/52]
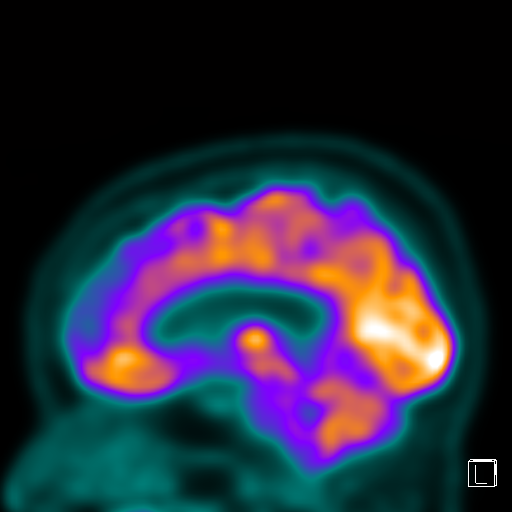
[im 31/52]
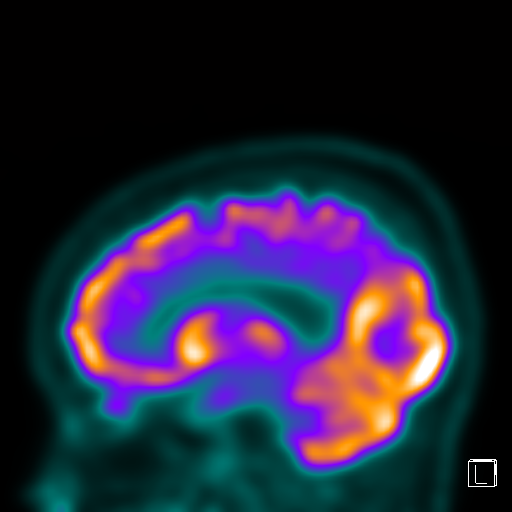
[im 35/52]
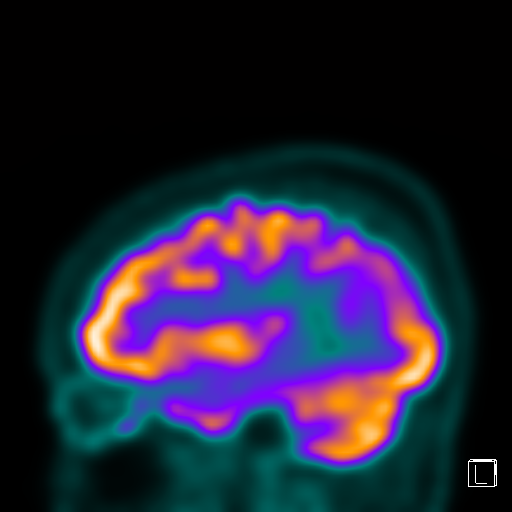
[im 38/52]
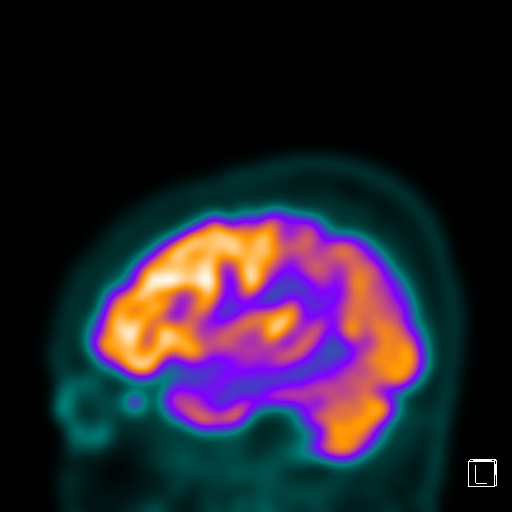
[im 41/52]
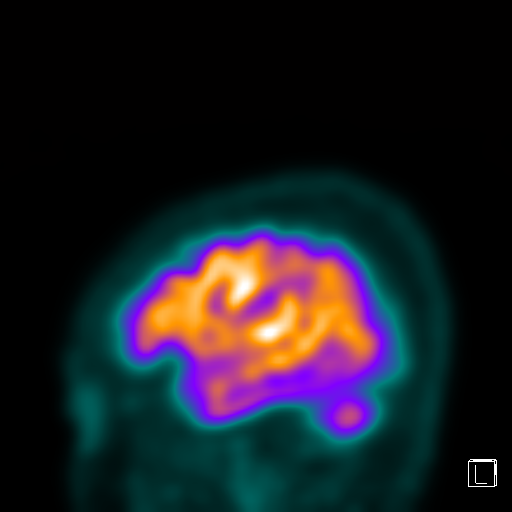
[im 48/52]
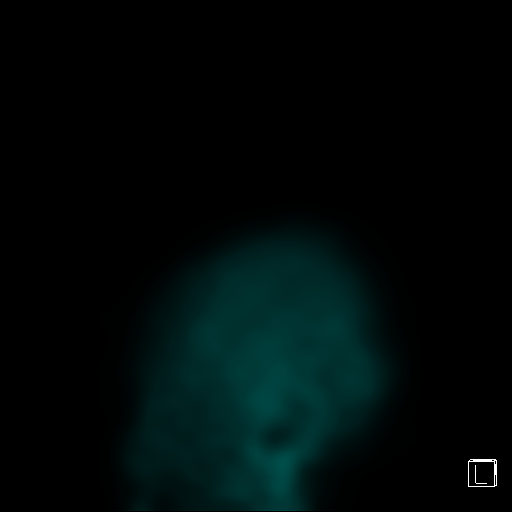
[im 52/52]
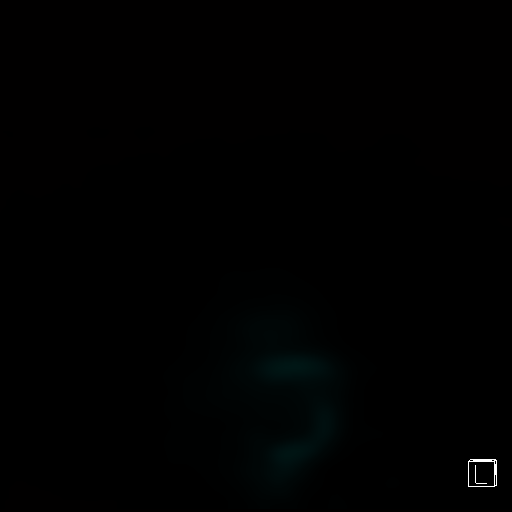

[20 of 25 positions shown; findings below may reference images not displayed]

FINDINGS: There is decreased relative cortical metabolism within the high
RIGHT parietal lobe. Normal relative cortical metabolism in the
frontal lobes. No convincing loss of cortical metabolism in the LEFT
parietal lobe.

Maintained cortical metabolism in the temporal lobes. Normal
cortical metabolism within the occipital lobes.
IMPRESSION: Decreased relative cortical metabolism in the high RIGHT parietal
lobe is a pattern suggestive of Alzheimer's pathology. Recommend
clinical correlation.

## 2020-09-18 MED ORDER — QUETIAPINE FUMARATE 25 MG PO TABS: 25.0000 mg | ORAL_TABLET | Freq: Every day | ORAL | 3 refills | Status: DC

## 2020-09-18 NOTE — Patient Instructions (Signed)
Your Plan:  Continue Exelon patch Continue Seroquel 25 mg at bedtime If your symptoms worsen or you develop new symptoms please let us know.    Thank you for coming to see [place] at [facility]. I hope we have been able to provide you high quality care today.  You may receive a patient satisfaction survey over the next few weeks. We would appreciate your feedback and comments so that we may continue to improve ourselves and the health of our patients.

## 2020-09-18 NOTE — Progress Notes (Signed)
PATIENT: [name] DOB: 07/06/1945  REASON FOR VISIT: follow up HISTORY FROM: patient PRIMARY NEUROLOGIST: Dr. Brett Fairy  HISTORY OF PRESENT ILLNESS: Today 09/18/20:  [name] is a 75 year old female with a history of Alzheimer's disease with behavioral disturbance.  She returns today for follow-up.  She is here today with her husband and son.  Husband reports that she is getting harder to care for due to more confusion and behavioral issues.  This primarily occurs in the evening hours.  For example the patient sometimes wants to cook and go buy food.  The husband refuses as he knows that she will not be able to do it.  This sometimes causes the patient to become aggravated.  The patient is currently on Seroquel 25 mg at bedtime.  She remains on Exelon patch 9.5 mg.  She is able to complete all ADLs independently.  She does not operate a motor vehicle.  Her husband helps manage her medications and appointments.    REVIEW OF SYSTEMS: Out of a complete 14 system review of symptoms, the patient complains only of the following symptoms, and all other reviewed systems are negative.  ALLERGIES: Allergies  Allergen Reactions   Ibuprofen Nausea And Vomiting    HOME MEDICATIONS: Outpatient Medications Prior to Visit  Medication Sig Dispense Refill   Multiple Vitamin (MULTIVITAMIN) tablet Take 1 tablet by mouth daily.     omeprazole (PRILOSEC) 20 MG capsule Take 1 capsule (20 mg total) by mouth daily. 90 capsule 3   QUEtiapine (SEROQUEL) 25 MG tablet Take 1 tablet (25 mg total) by mouth at bedtime. 30 tablet 5   rivastigmine (EXELON) 9.5 mg/24hr Place 1 patch (9.5 mg total) onto the skin daily. 30 patch 12   sertraline (ZOLOFT) 100 MG tablet Take 100 mg by mouth daily.     No facility-administered medications prior to visit.    PAST MEDICAL HISTORY: Past Medical History:  Diagnosis Date   Anxiety    Colon polyps    Depression    GERD (gastroesophageal reflux disease)    Hiatal  hernia    Internal hemorrhoids    Tinnitus     PAST SURGICAL HISTORY: Past Surgical History:  Procedure Laterality Date   ABDOMINAL HYSTERECTOMY     APPENDECTOMY     CERVICAL SPINE SURGERY     knee surgery Right    MOUTH SURGERY     roof of mouth   TONSILLECTOMY     WISDOM TOOTH EXTRACTION      FAMILY HISTORY: Family History  Problem Relation Age of Onset   Colon cancer Neg Hx    Breast cancer Neg Hx    Rectal cancer Neg Hx    Stomach cancer Neg Hx    Esophageal cancer Neg Hx     SOCIAL HISTORY: Social History   Socioeconomic History   Marital status: Married    Spouse name: Collier Salina   Number of children: 2   Years of education: hs   Highest education level: Not on file  Occupational History   Occupation: Retired  Tobacco Use   Smoking status: Never   Smokeless tobacco: Never  Vaping Use   Vaping Use: Never used  Substance and Sexual Activity   Alcohol use: Yes    Alcohol/week: 0.0 standard drinks    Comment: occ (glass of wine)   Drug use: No   Sexual activity: Yes    Birth control/protection: None  Other Topics Concern   Not on file  Social History Narrative  Right handed.   Caffeine 3 cups daily.  Married, 2 kids.  College.  Retired.     Social Determinants of Health   Financial Resource Strain: Not on file  Food Insecurity: Not on file  Transportation Needs: Not on file  Physical Activity: Not on file  Stress: Not on file  Social Connections: Not on file  Intimate Partner Violence: Not on file      PHYSICAL EXAM  Vitals:   09/18/20 1410  BP: 130/72  Pulse: 72  Weight: 146 lb (66.2 kg)  Height: 5\' 3"  (1.6 m)   Body mass index is 25.86 kg/m.  MMSE - Mini Mental State Exam 09/18/2020 02/12/2020 09/20/2019  Orientation to time 0 3 0  Orientation to Place 5 5 4   Registration 3 3 3   Attention/ Calculation 0 1 1  Attention/Calculation-comments - - -  Recall 0 0 0  Language- name 2 objects 2 2 2   Language- repeat 1 1 1   Language-  follow 3 step command 3 3 3   Language- read & follow direction 1 0 1  Write a sentence 0 1 1  Copy design 0 0 0  Total score 15 19 16      Generalized: Well developed, in no acute distress   Neurological examination  Mentation: Alert oriented to time, place, history taking. Follows all commands speech and language fluent Cranial nerve II-XII: Pupils were equal round reactive to light. Extraocular movements were full, visual field were full on confrontational test. Facial sensation and strength were normal. Uvula tongue midline. Head turning and shoulder shrug  were normal and symmetric. Motor: The motor testing reveals 5 over 5 strength of all 4 extremities. Good symmetric motor tone is noted throughout.  Sensory: Sensory testing is intact to soft touch on all 4 extremities. No evidence of extinction is noted.  Coordination: Cerebellar testing reveals good finger-nose-finger and heel-to-shin bilaterally.  Gait and station: Gait is normal.  Reflexes: Deep tendon reflexes are symmetric and normal bilaterally.   DIAGNOSTIC DATA (LABS, IMAGING, TESTING) - I reviewed patient records, labs, notes, testing and imaging myself where available.  Lab Results  Component Value Date   WBC 4.8 08/09/2015   HGB 13.6 08/09/2015   HCT 41.4 08/09/2015   MCV 81.9 08/09/2015   PLT 361.0 08/09/2015      Component Value Date/Time   NA 137 01/05/2014 1303   K 5.1 01/05/2014 1303   CL 100 01/05/2014 1303   CO2 28 01/05/2014 1303   GLUCOSE 95 01/05/2014 1303   GLUCOSE 80 09/17/2006 1233   BUN 14 01/05/2014 1303   CREATININE 0.79 01/05/2014 1303   CALCIUM 10.3 01/05/2014 1303   PROT 7.3 01/05/2014 1303   ALBUMIN 4.7 01/05/2014 1303   AST 22 01/05/2014 1303   ALT 24 01/05/2014 1303   ALKPHOS 74 01/05/2014 1303   BILITOT 0.2 01/05/2014 1303   GFRNONAA 78 01/05/2014 1303   GFRAA 90 01/05/2014 1303       ASSESSMENT AND PLAN 75 y.o. year old female  has a past medical history of Anxiety, Colon  polyps, Depression, GERD (gastroesophageal reflux disease), Hiatal hernia, Internal hemorrhoids, and Tinnitus. here with :  1.  Alzheimer's disease with behavioral disturbance  -- MMSE 15/30 previously 16 out of 30 --Continue Exelon patch 9.5 mg daily --Continue Seroquel 25 mg at bedtime --Discussed discussed nonpharmacological ways to manage her behavior. -- Also provided the patient and family with resources for dementia -- Follow-up in 6 months or sooner if needed  I spent 32 minutes of face-to-face and non-face-to-face time with patient.  This included previsit chart review, disease management, medication and resources   Ward Givens, MSN, NP-C 09/18/2020, 2:14 PM [facility] [address], [place], [place] [address] [phone]

## 2020-09-20 NOTE — Progress Notes (Signed)
I agree with the assessment and plan as directed by NP on this visit . I was available for consultation.   Tiahna Cure, MD  

## 2020-09-26 ENCOUNTER — Other Ambulatory Visit: Payer: Self-pay | Admitting: Neurology

## 2020-11-28 DIAGNOSIS — H25011 Cortical age-related cataract, right eye: Secondary | ICD-10-CM | POA: Diagnosis not present

## 2020-11-28 DIAGNOSIS — H2511 Age-related nuclear cataract, right eye: Secondary | ICD-10-CM | POA: Diagnosis not present

## 2020-11-28 DIAGNOSIS — H25811 Combined forms of age-related cataract, right eye: Secondary | ICD-10-CM | POA: Diagnosis not present

## 2020-12-04 ENCOUNTER — Other Ambulatory Visit: Payer: Self-pay | Admitting: Family Medicine

## 2020-12-04 DIAGNOSIS — Z1231 Encounter for screening mammogram for malignant neoplasm of breast: Secondary | ICD-10-CM

## 2020-12-19 DIAGNOSIS — H2512 Age-related nuclear cataract, left eye: Secondary | ICD-10-CM | POA: Diagnosis not present

## 2020-12-19 DIAGNOSIS — H25012 Cortical age-related cataract, left eye: Secondary | ICD-10-CM | POA: Diagnosis not present

## 2020-12-24 DIAGNOSIS — Z23 Encounter for immunization: Secondary | ICD-10-CM | POA: Diagnosis not present

## 2020-12-26 ENCOUNTER — Telehealth: Payer: Self-pay | Admitting: Adult Health

## 2020-12-26 NOTE — Telephone Encounter (Addendum)
Mr. Charette called back.  He stated they have new help in home, and pt is ranting/raving/cursing about her, saying that she is stealing things.  She is agitated. Pt on seroquel 25,g po qhs, and he did give her another dose this am to settle her down.  I relayed if acute change she may have some underlying infection and he did state that she has an UTI starting on CIPRO by pcp.  He states that these behaviors have been going on and wants to have the increase.  I will let MM/NP know and see her recommendation about increasing dose of seroquel at this time and will call him back.

## 2020-12-26 NOTE — Telephone Encounter (Signed)
Returned Peter's call [phone] and LVM asking for call back. Advised office closes at 5 pm.

## 2020-12-26 NOTE — Telephone Encounter (Signed)
Pt's husband is asking for a call from RN to discuss the request for a stronger dose of the QUEtiapine (SEROQUEL) 25 MG tablet for pt.

## 2020-12-26 NOTE — Telephone Encounter (Signed)
I called and had to [facility] for husband.  Relayed per Jinny Blossom, NP that would like for her to finish CIPRO first then see how she does, then may need to increase the seroquel if she still is agitated but to let us know.

## 2021-01-06 ENCOUNTER — Ambulatory Visit
Admission: RE | Admit: 2021-01-06 | Discharge: 2021-01-06 | Disposition: A | Discharge status: Home / Self Care | Payer: Medicare HMO | Source: Ambulatory Visit | Attending: Family Medicine | Admitting: Family Medicine

## 2021-01-06 ENCOUNTER — Other Ambulatory Visit: Payer: Self-pay

## 2021-01-06 DIAGNOSIS — Z1231 Encounter for screening mammogram for malignant neoplasm of breast: Secondary | ICD-10-CM | POA: Diagnosis not present

## 2021-01-06 MED ORDER — QUETIAPINE FUMARATE 50 MG PO TABS
50.0000 mg | ORAL_TABLET | Freq: Every day | ORAL | 3 refills | Status: DC
Start: 2021-01-06 — End: 2021-02-21

## 2021-01-06 NOTE — Telephone Encounter (Signed)
I called spoke to husband.  Pt finished the cipro and her UTI is gone.  She still has agitation, relating to new help in the house, also wanting to see her mother who is 45y [place], obsesses over this.  Asking to increase the seroquel as last requested.  Has appt in 02/2021 and would like to see Dr. Brett Fairy sooner if possible.  I relayed that ALZ dementia is progressive disease and ST memory would get worse.  Would be glad to send message to pod 1 for earlier time for Dr. Brett Fairy is one becomes available.

## 2021-01-06 NOTE — Telephone Encounter (Addendum)
Spoke to husband. Relayed that MM/NP increased to seroquel 50mg  po qhs.  Will use up the 25mg  tabs (taking 2 tabs po qhs until gone) then will start 50mg  po qhs. Prescription sent to [facility].  Husband verbalized understanding.

## 2021-01-06 NOTE — Telephone Encounter (Signed)
Pt's husband peter returned a call. Please call back.

## 2021-01-06 NOTE — Addendum Note (Signed)
Addended by: Brandon Melnick on: 01/06/2021 03:16 PM   Modules accepted: Orders

## 2021-01-20 ENCOUNTER — Telehealth: Payer: Self-pay | Admitting: Neurology

## 2021-01-20 ENCOUNTER — Ambulatory Visit: Payer: Medicare HMO | Admitting: Neurology

## 2021-01-20 NOTE — Telephone Encounter (Signed)
Appt cancelled due to MD out sick, left vm and sent mychart msg requesting cb to r/s.

## 2021-01-29 ENCOUNTER — Telehealth: Payer: Self-pay | Admitting: Adult Health

## 2021-01-29 NOTE — Telephone Encounter (Signed)
Pt husband is asking to be called if there is anyway pt can be seen before December. Appointment is on wait list

## 2021-01-29 NOTE — Telephone Encounter (Signed)
I spoke to husband offered appt with MM 01-29-21 at 0900, did not want that.  Will keep the 02-18-21 with MM at 1030.  He mentioned would like to speak to MM alone when in for appt. I said would relay .

## 2021-01-31 DIAGNOSIS — G309 Alzheimer's disease, unspecified: Secondary | ICD-10-CM | POA: Diagnosis not present

## 2021-01-31 DIAGNOSIS — F0280 Dementia in other diseases classified elsewhere without behavioral disturbance: Secondary | ICD-10-CM | POA: Diagnosis not present

## 2021-02-10 ENCOUNTER — Telehealth: Payer: Self-pay | Admitting: Adult Health

## 2021-02-10 NOTE — Telephone Encounter (Signed)
Appt changed to 12/2 due to NP being in meeting, LVM & sent mychart msg informing pt.

## 2021-02-18 ENCOUNTER — Ambulatory Visit: Payer: Medicare HMO | Admitting: Adult Health

## 2021-02-21 ENCOUNTER — Ambulatory Visit: Payer: Medicare HMO | Admitting: Adult Health

## 2021-02-21 ENCOUNTER — Encounter: Payer: Self-pay | Admitting: Adult Health

## 2021-02-21 VITALS — BP 94/60 | HR 83 | Ht 63.0 in | Wt 151.0 lb

## 2021-02-21 DIAGNOSIS — G301 Alzheimer's disease with late onset: Secondary | ICD-10-CM

## 2021-02-21 DIAGNOSIS — F02818 Dementia in other diseases classified elsewhere, unspecified severity, with other behavioral disturbance: Secondary | ICD-10-CM | POA: Diagnosis not present

## 2021-02-21 MED ORDER — QUETIAPINE FUMARATE 50 MG PO TABS: 50.0000 mg | ORAL_TABLET | Freq: Two times a day (BID) | ORAL | 3 refills | Status: DC
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
## 2021-02-25 ENCOUNTER — Telehealth: Payer: Self-pay | Admitting: Adult Health

## 2021-02-25 NOTE — Telephone Encounter (Signed)
Asking for new prescription on lorazepam 0.5mg  po bid prn #60.  ( drug registry checked, last fill 02-07-20 #60 by pcp, Dr. Suzanna Obey).   Per last ofv note 02-21-2021:   Alzheimer's Disease   Continue Exelon 9.5mg Margo Aye Advised that the patient could take Seroquel 50 mg twice a day but its not to be taken as needed Patient's husband will call us back and let us know of the Ativan dose may give a prescription to help with breakthrough symptoms Follow-up in 6 months or sooner if needed

## 2021-02-25 NOTE — Telephone Encounter (Signed)
FYI- Pt's husband [name] called states his wife's [name] refills have expired and he thought provider should know so that his wife can get more refills of it.

## 2021-02-26 DIAGNOSIS — Z0000 Encounter for general adult medical examination without abnormal findings: Secondary | ICD-10-CM | POA: Diagnosis not present

## 2021-02-26 DIAGNOSIS — R7303 Prediabetes: Secondary | ICD-10-CM | POA: Diagnosis not present

## 2021-02-26 DIAGNOSIS — G309 Alzheimer's disease, unspecified: Secondary | ICD-10-CM | POA: Diagnosis not present

## 2021-02-26 DIAGNOSIS — F321 Major depressive disorder, single episode, moderate: Secondary | ICD-10-CM | POA: Diagnosis not present

## 2021-02-26 DIAGNOSIS — F0280 Dementia in other diseases classified elsewhere without behavioral disturbance: Secondary | ICD-10-CM | POA: Diagnosis not present

## 2021-02-26 MED ORDER — LORAZEPAM 0.5 MG PO TABS: 0.5000 mg | ORAL_TABLET | Freq: Every day | ORAL | 4 refills | Status: DC | PRN

## 2021-02-26 NOTE — Telephone Encounter (Signed)
I called husband of pt at the number he listed and no answer.  I will send mychart message and relay to him about this medication and dosing.

## 2021-02-26 NOTE — Telephone Encounter (Signed)
I refilled Ativan 0.5 mg to be taken daily if needed.  I decreased the dose from twice a day to only daily.  If needed twice a day in the future we can consider adjusting the dose

## 2021-02-26 NOTE — Addendum Note (Signed)
Addended by: Trudie Buckler on: 02/26/2021 02:07 PM   Modules accepted: Orders

## 2021-03-04 ENCOUNTER — Telehealth: Payer: Self-pay | Admitting: Adult Health

## 2021-03-04 NOTE — Telephone Encounter (Signed)
Pt's husband, [name] called, she is refusing to take her medication. I want to know if someone can call to speak with her so she will take her medication.
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
## 2021-03-10 ENCOUNTER — Ambulatory Visit: Payer: Medicare HMO | Admitting: Neurology

## 2021-03-12 NOTE — Progress Notes (Signed)
I agree with the Alzheimer patient assessment and plan, as directed and discussed with NP. Larey Seat, MD

## 2021-03-26 ENCOUNTER — Telehealth: Payer: Self-pay | Admitting: Adult Health

## 2021-03-26 NOTE — Telephone Encounter (Signed)
Husband feels a medication pt is on is causing hallucinations, he would like a call to discuss.

## 2021-03-27 NOTE — Telephone Encounter (Signed)
For clarification is the patient husband suggesting that one of the medication she is on is causing hallucinations Per note from [place] or is he asking for [place] to adjust her medications to help with her with hallucinations?

## 2021-03-27 NOTE — Telephone Encounter (Signed)
Husband called back.  He states that pt is having hallucinations worsening since last seen 02-21-2021.  Constant daily, seeing people that are not here, constantly looking for them.  (Thinking her mother is there, who died in [place] repul[place]).  I asked if the hallucinations are terrifying her, and he stated yes.  She is taking the seroqul 50mg  po bid and ativan 0.5mg  po daily (he gives daily).  No exelon patch, and stated that he is not going to give to her (he feels that is not helping at all).  Please advise.

## 2021-03-27 NOTE — Telephone Encounter (Signed)
LMVM for pts husband, mobile that returned call.

## 2021-03-27 NOTE — Telephone Encounter (Signed)
Made appt with MM/NP for 03-31-2021 at 1330 arrive 1300 discuss plan. SE of medications casuing hallucinations. Has appt with Dr. Brett Fairy 08-20-2021.

## 2021-03-31 ENCOUNTER — Ambulatory Visit: Payer: Medicare HMO | Admitting: Adult Health

## 2021-03-31 ENCOUNTER — Other Ambulatory Visit: Payer: Self-pay

## 2021-03-31 ENCOUNTER — Encounter: Payer: Self-pay | Admitting: Adult Health

## 2021-03-31 VITALS — BP 127/74 | HR 74 | Ht 62.0 in | Wt 155.2 lb

## 2021-03-31 DIAGNOSIS — G301 Alzheimer's disease with late onset: Secondary | ICD-10-CM

## 2021-03-31 DIAGNOSIS — F02818 Dementia in other diseases classified elsewhere, unspecified severity, with other behavioral disturbance: Secondary | ICD-10-CM

## 2021-03-31 MED ORDER — LORAZEPAM 0.5 MG PO TABS: 0.2500 mg | ORAL_TABLET | Freq: Two times a day (BID) | ORAL | 4 refills | Status: DC | PRN

## 2021-03-31 NOTE — Progress Notes (Signed)
PATIENT: [name] DOB: 02/06/1946  REASON FOR VISIT: follow up HISTORY FROM: patient   HISTORY OF PRESENT ILLNESS: Today 03/31/21:  Ms. [name] is a 76 year old female with a history of Alzheimer's disease with behavioral disturbance.  She returns today for follow-up.  She is here today with her husband and son.  Her husband reports that she is still having behavioral disturbances and delusions.  She is convinced that her aide is having an affair with her husband.  Her husband reports that sometimes she is hard to manage.  He has been giving her Ativan up to twice a day even the prescription is written for once a day.  He also states that prior to this appointment he was having trouble getting her to come and he gave her Ativan and another dose of Seroquel.  He states that there are some days that he gives her up to 4 doses of Seroquel.  Is currently prescribed 50 mg twice a day.  Son states that she tends to be most agitated with him.  She does go to a day center twice a week and does well there.  When her aide is there she does well with her.  Most of her agitation is directed at her husband.  Ms. [name] is a 76 year old female with a history of Alzheimer's disease with behavioral disturbance.  She returns today for follow-up.  Her husband is with her.  He feels that her symptoms have worsened.  He has an aide that comes in 3 days a week and reports that the patient gets very agitated with her.  States that the patient accuses him of having an affair with her.  He has been dosing her Seroquel giving her 50 mg 3 times a day.  Per our records we prescribed 50 mg at bedtime.  He also reports that he had an old prescription of Ativan for the patient and has been giving her that when she has uncontrollable agitation.  He states that it typically works well.  She is continued on the Exelon patch however he is considering discontinuing this medication.  They typically travel to [place] for the winter  but he has not made a decision if he is going to go.   HISTORY 09/18/20:  Ms. [name] is a 76 year old female with a history of Alzheimer's disease with behavioral disturbance.  She returns today for follow-up.  She is here today with her husband and son.  Husband reports that she is getting harder to care for due to more confusion and behavioral issues.  This primarily occurs in the evening hours.  For example the patient sometimes wants to cook and go buy food.  The husband refuses as he knows that she will not be able to do it.  This sometimes causes the patient to become aggravated.  The patient is currently on Seroquel 25 mg at bedtime.  She remains on Exelon patch 9.5 mg.  She is able to complete all ADLs independently.  She does not operate a motor vehicle.  Her husband helps manage her medications and appointments.  REVIEW OF SYSTEMS: Out of a complete 14 system review of symptoms, the patient complains only of the following symptoms, and all other reviewed systems are negative.  See HPI  ALLERGIES: Allergies  Allergen Reactions   Ibuprofen Nausea And Vomiting    HOME MEDICATIONS: Outpatient Medications Prior to Visit  Medication Sig Dispense Refill   LORazepam (ATIVAN) 0.5 MG tablet Take 1 tablet (0.5 mg  total) by mouth daily as needed for anxiety. 30 tablet 4   Multiple Vitamin (MULTIVITAMIN) tablet Take 1 tablet by mouth daily.     omeprazole (PRILOSEC) 20 MG capsule Take 1 capsule (20 mg total) by mouth daily. 90 capsule 3   QUEtiapine (SEROQUEL) 50 MG tablet Take 1 tablet (50 mg total) by mouth 2 (two) times daily. 60 tablet 3   sertraline (ZOLOFT) 100 MG tablet Take 100 mg by mouth daily.     rivastigmine (EXELON) 9.5 mg/24hr APPLY 1 PATCH ONCE DAILY TO THE SKIN 30 patch 11   No facility-administered medications prior to visit.    PAST MEDICAL HISTORY: Past Medical History:  Diagnosis Date   Anxiety    Colon polyps    Depression    GERD (gastroesophageal reflux  disease)    Hiatal hernia    Internal hemorrhoids    Tinnitus     PAST SURGICAL HISTORY: Past Surgical History:  Procedure Laterality Date   ABDOMINAL HYSTERECTOMY     APPENDECTOMY     CERVICAL SPINE SURGERY     knee surgery Right    MOUTH SURGERY     roof of mouth   TONSILLECTOMY     WISDOM TOOTH EXTRACTION      FAMILY HISTORY: Family History  Problem Relation Age of Onset   Colon cancer Neg Hx    Breast cancer Neg Hx    Rectal cancer Neg Hx    Stomach cancer Neg Hx    Esophageal cancer Neg Hx    Alzheimer's disease Neg Hx    Dementia Neg Hx     SOCIAL HISTORY: Social History   Socioeconomic History   Marital status: Married    Spouse name: [name] Amy   Number of children: 2   Years of education: hs   Highest education level: Not on file  Occupational History   Occupation: Retired  Tobacco Use   Smoking status: Never   Smokeless tobacco: Never  Vaping Use   Vaping Use: Never used  Substance and Sexual Activity   Alcohol use: Yes    Alcohol/week: 0.0 standard drinks    Comment: occ (glass of wine)   Drug use: No   Sexual activity: Yes    Birth control/protection: None  Other Topics Concern   Not on file  Social History Narrative   Right handed.   Caffeine 1-2 cups daily.  Married, 2 kids.  College.  Retired.     Social Determinants of Health   Financial Resource Strain: Not on file  Food Insecurity: Not on file  Transportation Needs: Not on file  Physical Activity: Not on file  Stress: Not on file  Social Connections: Not on file  Intimate Partner Violence: Not on file      PHYSICAL EXAM  Vitals:   03/31/21 1322  BP: 127/74  Pulse: 74  Weight: 155 lb 3.2 oz (70.4 kg)  Height: 5\' 2"  (1.575 m)   Body mass index is 28.39 kg/m. MMSE - Mini Mental State Exam 02/21/2021 09/18/2020 02/12/2020  Orientation to time 1 0 3  Orientation to Place 1 5 5   Registration 3 3 3   Attention/ Calculation 0 0 1  Attention/Calculation-comments - - -  Recall 0  0 0  Language- name 2 objects 2 2 2   Language- repeat 0 1 1  Language- follow 3 step command 2 3 3   Language- read & follow direction 0 1 0  Write a sentence 1 0 1  Copy design 0 0 0  Total score 10 15 19      Generalized: Well developed, in no acute distress   Neurological examination  Mentation: Drowsy.  Follows all commands speech and language fluent Cranial nerve II-XII: Pupils were equal round reactive to light. Extraocular movements were full, visual field were full on confrontational test. Facial sensation and strength were normal. Uvula tongue midline. Head turning and shoulder shrug  were normal and symmetric. Motor: The motor testing reveals 5 over 5 strength of all 4 extremities. Good symmetric motor tone is noted throughout.  Sensory: Sensory testing is intact to soft touch on all 4 extremities. No evidence of extinction is noted.  Coordination: Cerebellar testing reveals good finger-nose-finger and heel-to-shin bilaterally.  Difficulty with comprehending finger-nose-finger Gait and station: Gait is normal.  Reflexes: Deep tendon reflexes are symmetric and normal bilaterally.   DIAGNOSTIC DATA (LABS, IMAGING, TESTING) - I reviewed patient records, labs, notes, testing and imaging myself where available.  Lab Results  Component Value Date   WBC 4.8 08/09/2015   HGB 13.6 08/09/2015   HCT 41.4 08/09/2015   MCV 81.9 08/09/2015   PLT 361.0 08/09/2015      Component Value Date/Time   NA 137 01/05/2014 1303   K 5.1 01/05/2014 1303   CL 100 01/05/2014 1303   CO2 28 01/05/2014 1303   GLUCOSE 95 01/05/2014 1303   GLUCOSE 80 09/17/2006 1233   BUN 14 01/05/2014 1303   CREATININE 0.79 01/05/2014 1303   CALCIUM 10.3 01/05/2014 1303   PROT 7.3 01/05/2014 1303   ALBUMIN 4.7 01/05/2014 1303   AST 22 01/05/2014 1303   ALT 24 01/05/2014 1303   ALKPHOS 74 01/05/2014 1303   BILITOT 0.2 01/05/2014 1303   GFRNONAA 78 01/05/2014 1303   GFRAA 90 01/05/2014 1303       ASSESSMENT AND PLAN 76 y.o. year old female  has a past medical history of Anxiety, Colon polyps, Depression, GERD (gastroesophageal reflux disease), Hiatal hernia, Internal hemorrhoids, and Tinnitus. here with:  Alzheimer's Disease  I had a long discussion with the patient's husband and son.  I explained the importance of taking the medication as prescribed.  The patient may benefit from going to a day center 5 days a week.  I do feel that her husband as the primary caregiver is getting burned out.  I expressed this to him.  Also advised that they should consider a memory facility for the patient.  I do not feel that increasing the Seroquel will offer her any additional benefit.  The patient's husband was already doing that with minimal benefit.  She will continue on Seroquel 50 mg twice a day I did advise that she could take Ativan 0.5 mg half a tablet up to twice a day if needed for behavior.  The patient's family will discuss their options for her ongoing care.  In the future we may consider switching Seroquel to Risperdal.  I spent 45 minutes of face-to-face and non-face-to-face time with patient.  Most of this time was spent discussing the patient's agitation, behavioral concerns and medication management.     Ward Givens, MSN, NP-C 03/31/2021, 1:36 PM [facility] [address], [place], [place] [address] [phone]

## 2021-03-31 NOTE — Patient Instructions (Signed)
Your Plan:  Take Seroquel 50 mg in the AM (at breakfast) then take 50 mg at bedtime ( 1 hour before bedtime) Use Ativan 0.5 mg 1/2 tablet twice a day if needed.  If your symptoms worsen or you develop new symptoms please let us know.    Thank you for coming to see [place] at [facility]. I hope we have been able to provide you high quality care today.  You may receive a patient satisfaction survey over the next few weeks. We would appreciate your feedback and comments so that we may continue to improve ourselves and the health of our patients.

## 2021-04-10 NOTE — Telephone Encounter (Signed)
Updated pt's husband on NP's recommendation( via vm) if pt calls back phone room can relay recommendation as well.

## 2021-04-10 NOTE — Telephone Encounter (Signed)
If he is considering placement at a facility I would hold off on making any medication changes

## 2021-04-10 NOTE — Telephone Encounter (Signed)
Pt's husband is asking for a call to discuss the change in medication that Jinny Blossom, NP mentioned due to pt's hallucinations worsening

## 2021-04-10 NOTE — Telephone Encounter (Signed)
I called husband of pt.  He is looking at placement for Shirlee Limerick at [facility] (has assessment next week).  Has FLS2 to be done it sounds like by pcp.  She was at [facility] twice weekly and had today had a meltdown, dizzy, felt bad.  Is home now and doing ok.  He gave her ativan 1/2 tab.  Still with hallucination/delusions.  Would like to see about trying risperdol.  Please advise.

## 2021-04-11 DIAGNOSIS — R4689 Other symptoms and signs involving appearance and behavior: Secondary | ICD-10-CM | POA: Diagnosis not present

## 2021-04-11 DIAGNOSIS — R350 Frequency of micturition: Secondary | ICD-10-CM | POA: Diagnosis not present

## 2021-04-11 DIAGNOSIS — R32 Unspecified urinary incontinence: Secondary | ICD-10-CM | POA: Diagnosis not present

## 2021-04-11 DIAGNOSIS — G309 Alzheimer's disease, unspecified: Secondary | ICD-10-CM | POA: Diagnosis not present

## 2021-04-11 DIAGNOSIS — F0280 Dementia in other diseases classified elsewhere without behavioral disturbance: Secondary | ICD-10-CM | POA: Diagnosis not present

## 2021-04-11 NOTE — Telephone Encounter (Signed)
Pt's husband has called back, the message from Jinny Blossom was relayed.  Husband states he is wanting pt to continue to go to [facility] but now wants her to do 3 days in Memory care and 2 days at [facility] a [facility]) please call.

## 2021-04-14 NOTE — Telephone Encounter (Signed)
I called pts husband back.  I relayed that medication risperdal, Jinny Blossom wanted to hold if if Lynnley was going to going to to another facility.  Husbands message about her going to memory care 3 days a week?  And then outpt [facility]?  Was not sure if that was an option. I LMVM for him to call me back re : questions.

## 2021-04-19 DIAGNOSIS — Z20822 Contact with and (suspected) exposure to covid-19: Secondary | ICD-10-CM | POA: Diagnosis not present

## 2021-04-29 ENCOUNTER — Telehealth: Payer: Self-pay | Admitting: Adult Health

## 2021-04-29 NOTE — Telephone Encounter (Signed)
Pt's husband, Mariaisabel Bodiford (on [place]) she having hallucinations for about a month. Does not know who is, seeing people that are not around. Would like a call from the nurse to discuss a medication  for her hallucinations

## 2021-04-30 NOTE — Telephone Encounter (Signed)
Please let patient know I will discuss with Dr. Brett Fairy when she returns

## 2021-04-30 NOTE — Telephone Encounter (Signed)
I called and LMVM for husband that called back.

## 2021-04-30 NOTE — Telephone Encounter (Signed)
I spoke to husband.  Pt continues with hallucinations/delusions nightly (seeing people that are not there) anxious but (Not terrifying).  Seems to be more when with husband.  He did not mention that they said anything at the daycenters about hallucinations/delusions.  She is on ativan 0.5mg  po BID prn, seroquel 50mg  po bid, sertraline  50mg  po bid, No exelon patch. He stated that there is no change from last visit 03-31-2021.  She is now currently going to [facility] 9-530pm 3 days a week and and 2 days a week at [facility] 10-[facility].  I relayed that since no change she may want to monitor things.  I know from her last ofv note there was some talk about changing Seroquel to Risperdal.  Please advise.

## 2021-04-30 NOTE — Telephone Encounter (Signed)
Pt's husband is asking for a call back.

## 2021-04-30 NOTE — Telephone Encounter (Signed)
LVM message for Husband (checked DPR) to call me back at the office concerning patients Hallucinations .

## 2021-05-01 NOTE — Telephone Encounter (Signed)
I called the pt's husband back and relayed message from NP. Husband was in agreement to this plan and will wait for call back next week.

## 2021-05-06 NOTE — Telephone Encounter (Signed)
Dr. Nadean Corwin review, patient has already been on Seroquel high doses (patient's husband increase the dose on his own).  Continues to use Ativan as needed.  It seems that her agitation is primarily when she is around her husband.  She is at [facility] during the day and at the last visit they were not having any issues with her behavior at the facility. Your thoughts?

## 2021-05-08 NOTE — Addendum Note (Signed)
Addended by: Larey Seat on: 05/08/2021 10:23 AM   Modules accepted: Orders

## 2021-05-08 NOTE — Telephone Encounter (Signed)
Dr. Nadean Corwin review my note.  The patient has already tried higher doses of Seroquel without benefit.  Do you have any other recommendations if not I will be happy to tell him to continue with the Seroquel?

## 2021-05-08 NOTE — Telephone Encounter (Signed)
Patient seems to her husband mostly agitated at night, sun -downing. In daytime, she is doing well and would not need medication.  Seroquel in Pm and at night will allow him to care for her at home. I am ok with dose increase, also recommend to try 1 tab seroquel 50 mg at bedtime and 1/2 tab in afternoon. If this doesn't work, I am ok with 50 mg bid.  Larey Seat, MD

## 2021-05-13 ENCOUNTER — Other Ambulatory Visit: Payer: Self-pay

## 2021-05-13 ENCOUNTER — Ambulatory Visit: Payer: Medicare HMO | Admitting: Neurology

## 2021-05-13 ENCOUNTER — Encounter: Payer: Self-pay | Admitting: Neurology

## 2021-05-13 VITALS — BP 130/71 | HR 79 | Ht 62.0 in | Wt 157.0 lb

## 2021-05-13 DIAGNOSIS — G309 Alzheimer's disease, unspecified: Secondary | ICD-10-CM | POA: Diagnosis not present

## 2021-05-13 DIAGNOSIS — G300 Alzheimer's disease with early onset: Secondary | ICD-10-CM

## 2021-05-13 DIAGNOSIS — F0280 Dementia in other diseases classified elsewhere without behavioral disturbance: Secondary | ICD-10-CM | POA: Diagnosis not present

## 2021-05-13 DIAGNOSIS — F02818 Dementia in other diseases classified elsewhere, unspecified severity, with other behavioral disturbance: Secondary | ICD-10-CM | POA: Diagnosis not present

## 2021-05-13 MED ORDER — RISPERIDONE 0.5 MG PO TABS: ORAL_TABLET | ORAL | 2 refills | Status: DC

## 2021-05-13 NOTE — Telephone Encounter (Signed)
Please review note. Patient has already been on higher doses of seroquel

## 2021-05-13 NOTE — Patient Instructions (Addendum)
Dementia Dementia is a condition that affects the way the brain functions. It often affects memory and thinking. Usually, dementia gets worse with time and cannot be reversed (progressive dementia). There are many types of dementia, including: Alzheimer's disease. This type is the most common. Vascular dementia. This type may happen as the result of a stroke. Lewy body dementia. This type may happen to people who have Parkinson's disease. Frontotemporal dementia. This type is caused by damage to nerve cells (neurons) in certain parts of the brain. Some people may be affected by more than one type of dementia. This is called mixed dementia. What are the causes? Dementia is caused by damage to cells in the brain. The area of the brain and the types of cells damaged determine the type of dementia. Usually, this damage is irreversible or cannot be undone. Some examples of irreversible causes include: Conditions that affect the blood vessels of the brain, such as diabetes, heart disease, or blood vessel disease. Genetic mutations. In some cases, changes in the brain may be caused by another condition and can be reversed or slowed. Some examples of reversible causes include: Injury to the brain. Certain medicines. Infection, such as meningitis. Metabolic problems, such as vitamin B12 deficiency or thyroid disease. Pressure on the brain, such as from a tumor, blood clot, or too much fluid in the brain (hydrocephalus). Autoimmune diseases that affect the brain or arteries, such as limbic encephalitis or vasculitis. What are the signs or symptoms? Symptoms of dementia depend on the type of dementia. Common signs of dementia include problems with remembering, thinking, problem solving, decision making, and communicating. These signs develop slowly or get worse with time. This may include: Problems remembering events or people. Having trouble taking a bath or putting clothes on. Forgetting appointments or  forgetting to pay bills. Difficulty planning and preparing meals. Having trouble speaking. Getting lost easily. Changes in behavior or mood. How is this diagnosed? This condition is diagnosed by a specialist (neurologist). It is diagnosed based on the history of your symptoms, your medical history, a physical exam, and tests. Tests may include: Tests to evaluate brain function, such as memory tests, cognitive tests, and other tests. Lab tests, such as blood or urine tests. Imaging tests, such as a CT scan, a PET scan, or an MRI. Genetic testing. This may be done if other family members have a diagnosis of certain types of dementia. Your health care provider will talk with you and your family, friends, or caregivers about your history and symptoms. How is this treated? Treatment for this condition depends on the cause of the dementia. Progressive dementias, such as Alzheimer's disease, cannot be cured, but there may be treatments that help to manage symptoms. Treatment might involve taking medicines that may help to: Control the dementia. Slow down the progression of the dementia. Manage symptoms. In some cases, treating the cause of your dementia can improve symptoms, reverse symptoms, or slow down how quickly your dementia becomes worse. Your health care provider can direct you to support groups, organizations, and other health care providers who can help with decisions about your care. Follow these instructions at home: Medicines Take over-the-counter and prescription medicines only as told by your health care provider. Use a pill organizer or pill reminder to help you manage your medicines. Avoid taking medicines that can affect thinking, such as pain medicines or sleeping medicines. Lifestyle Make healthy lifestyle choices. Be physically active as told by your health care provider. Do not use any  products that contain nicotine or tobacco, such as cigarettes, e-cigarettes, and chewing  tobacco. If you need help quitting, ask your health care provider. Do not drink alcohol. Practice stress-management techniques when you get stressed. Spend time with other people. Make sure to get quality sleep. These tips can help you get a good night's rest: Avoid napping during the day. Keep your sleeping area dark and cool. Avoid exercising during the few hours before you go to bed. Avoid caffeine products in the evening. Eating and drinking Drink enough fluid to keep your urine pale yellow. Eat a healthy diet. General instructions  Work with your health care provider to determine what you need help with and what your safety needs are. Talk with your health care provider about whether it is safe for you to drive. If you were given a bracelet that identifies you as a person with memory loss or tracks your location, make sure to wear it at all times. Work with your family to make important decisions, such as advance directives, medical power of attorney, or a living will. Keep all follow-up visits. This is important. Where to find more information Alzheimer's Association: [employer] National Institute on Aging: DVDEnthusiasts.nl World Health Organization: [employer]Link.com.br Contact a health care provider if: You have any new or worsening symptoms. You have problems with choking or swallowing. Get help right away if: You feel depressed or sad, or feel that you want to harm yourself. Your family members become concerned for your safety. If you ever feel like you may hurt yourself or others, or have thoughts about taking your own life, get help right away. Go to your nearest emergency department or: Call your local emergency services (911 in the [place] Call a suicide crisis helpline, such as the [place] at (757) 406-5785 or 988 in the [place]. This is open 24 hours a day in the [place] Text the Crisis Text Line at 570-447-7880 (in the [place].). Summary Dementia is a  condition that affects the way the brain functions. Dementia often affects memory and thinking. Usually, dementia gets worse with time and cannot be reversed (progressive dementia). Treatment for this condition depends on the cause of the dementia. Work with your health care provider to determine what you need help with and what your safety needs are. Your health care provider can direct you to support groups, organizations, and other health care providers who can help with decisions about your care. This information is not intended to replace advice given to you by your health care provider. Make sure you discuss any questions you have with your health care provider. Document Revised: 10/02/2020 Document Reviewed: 07/24/2019 Elsevier Patient Education  2022 Elsevier [employer].Risperidone tablets What is this medication? RISPERIDONE (ris PER i done) is an antipsychotic. It is used to treat schizophrenia, bipolar disorder, and some symptoms of autism. This medicine may be used for other purposes; ask your health care provider or pharmacist if you have questions. COMMON BRAND NAME(S): Risperdal What should I tell my care team before I take this medication? They need to know if you have any of these conditions: dementia diabetes difficulty swallowing have trouble controlling your muscles heart disease high cholesterol high levels of prolactin history of breast cancer history of irregular heartbeat history of stroke kidney disease liver disease low blood counts, like low white cell, platelet, or red cell counts low blood pressure Parkinson's disease seizures an unusual or allergic reaction to risperidone, paliperidone, other medicines, foods, dyes, or preservatives pregnant or trying to  get pregnant breast-feeding How should I use this medication? Take this medicine by mouth with a glass of water. Follow the directions on the prescription label. You can take it with or without food. If it  upsets your stomach, take it with food. Take your medicine at regular intervals. Do not take it more often than directed. Do not stop taking except on your doctor's advice. Talk to your pediatrician regarding the use of this medicine in children. While this drug may be prescribed for children as young as 44 years of age for selected conditions, precautions do apply. Overdosage: If you think you have taken too much of this medicine contact a poison control center or emergency room at once. NOTE: This medicine is only for you. Do not share this medicine with others. What if I miss a dose? If you miss a dose, take it as soon as you can. If it is almost time for your next dose, take only that dose. Do not take double or extra doses. What may interact with this medication? Do not take this medicine with any of the following medications: cisapride dextromethorphan; quinidine dronedarone metoclopramide pimozide quinidine thioridazine This medicine may also interact with the following medications: alcohol antihistamines for allergy, cough, and cold certain medicines for anxiety or sleep certain medicines for depression like amitriptyline, fluoxetine, paroxetine, sertraline general anesthetics like halothane, isoflurane, methoxyflurane, propofol levodopa or other medicines for Parkinson's disease medicines for blood pressure medicines for seizures medicines that relax muscles for surgery methylphenidate narcotic medicines for pain other medicines that prolong the QT interval (cause an abnormal heart rhythm) phenothiazines like chlorpromazine, prochlorperazine rifampin This list may not describe all possible interactions. Give your health care provider a list of all the medicines, herbs, non-prescription drugs, or dietary supplements you use. Also tell them if you smoke, drink alcohol, or use illegal drugs. Some items may interact with your medicine. What should I watch for while using this  medication? Visit your health care professional for regular checks on your progress. Tell your health care professional if symptoms do not start to get better or if they get worse. Do not stop taking except on your health care professional's advice. You may develop a severe reaction. Your health care professional will tell you how much medicine to take. You may get dizzy or drowsy. Do not drive, use machinery, or do anything that needs mental alertness until you know how this medicine affects you. Do not stand or sit up quickly, especially if you are an older patient. This reduces the risk of dizzy or fainting spells. Alcohol may interfere with the effect of this medicine. Avoid alcoholic drinks. This drug can cause problems with controlling your body temperature. It can lower the response of your body to cold temperatures. If possible, stay indoors during cold weather. If you must go outdoors, wear warm clothes. It can also lower the response of your body to heat. Do not overheat. Do not over-exercise. Stay out of the sun when possible. If you must be in the sun, wear cool clothing. Drink plenty of water. If you have trouble controlling your body temperature, call your health care provider right away. This medicine may increase blood sugar. Ask your health care provider if changes in diet or medicines are needed if you have diabetes. What side effects may I notice from receiving this medication? Side effects that you should report to your doctor or health care professional as soon as possible: allergic reactions like skin rash, itching or  hives, swelling of the face, lips, or tongue breast enlargement in both males and females breathing problems confusion fast, irregular heartbeat fever or chills, sore throat inability to keep still males: prolonged or painful erection missed menstrual periods problems with balance, talking, walking redness, blistering, peeling, or loosening of the skin, including  inside the mouth seizures signs and symptoms of high blood sugar such as being more thirsty or hungry or having to urinate more than normal. You may also feel very tired or have blurry vision signs and symptoms of low blood pressure like dizziness; feeling faint or lightheaded, falls; unusually weak or tired signs and symptoms of neuroleptic malignant syndrome like confusion; fast or irregular heartbeat; high fever; increased sweating; stiff muscles sudden numbness or weakness of the face, arm, or leg suicidal thoughts or other mood changes trouble swallowing uncontrollable movements of the arms, face, head, mouth, neck, or upper body Side effects that usually do not require medical attention (report to your doctor or health care professional if they continue or are bothersome): changes in sex drive or performance constipation drowsiness dry mouth upset stomach weight gain This list may not describe all possible side effects. Call your doctor for medical advice about side effects. You may report side effects to FDA at 1-800-FDA-1088. Where should I keep my medication? Keep out of the reach of children and pets. Store at room temperature between 15 and 25 degrees C (59 and 77 degrees F). Protect from light. Get rid of any unused medicine after the expiration date. To get rid of medicines that are no longer needed or have expired: Take the medicine to a medicine take-back program. Check with your pharmacy or law enforcement to find a location. If you cannot return the medicine, check the label or package insert to see if the medicine should be thrown out in the garbage or flushed down the toilet. If you are not sure, ask your health care provider. If it is safe to put it in the trash, take the medicine out of the container. Mix the medicine with cat litter, dirt, coffee grounds, or other unwanted substance. Seal the mixture in a bag or container. Put it in the trash. NOTE: This sheet is a  summary. It may not cover all possible information. If you have questions about this medicine, talk to your doctor, pharmacist, or health care provider.  2022 Elsevier/Gold Standard (2020-11-26 00:00:00)

## 2021-05-13 NOTE — Telephone Encounter (Signed)
We changed today to RESPERIDOL, up to 3 a day. D/c seroquel.  Left prn ativan on med list. Discussed memory Unit / facility placement. She is doing well with 3 times a week day-clinic at [facility], but I  feel her husband needs respite care. Discussed increasing [facility] to 5 times a week and having a caretaker come in on weekends to do activities with [name] under [place]. Taking walks, visiting the [facility], going shopping, preparing smaller meals together.  If possible, have Remmi stay in memory care for  period of 2-3 weeks to help husband get things in order and take care of himself.  CD

## 2021-05-13 NOTE — Progress Notes (Signed)
PATIENT: [name] DOB: 29-Mar-1945  REASON FOR VISIT: follow up HISTORY FROM: husband and son,    HISTORY OF PRESENT ILLNESS:  05/13/21:  05-13-2021. Early onset of Alzheimer's Disease , progressed with severe behavior changes.  Agitation, Paranoid hallucination.   Hallucinations and sun downing, restlessly repeating activities- taking items out of drawer and placed back and repeat- accusing the cleaner or caretaker of stealing.  She has trouble using hot and cold water in the shower- I suggested a scolding protector from home improvement store.  Also protect valuables from being discarded by Shirlee Limerick, using a safe.   She is agitated by 3.30 PM and often aggressive.  Go[facility] to [facility] and enjoys this 3 times a week. There she is occupied and not as agitated at all, the day ends at 5.30 PM.  I suggested to find another activity for the remaining 2 days or expand the [facility] program to 5  days.   I had a long discussion with the patient's husband and son.  I explained the importance of taking the medication as prescribed.    I also feel that her husband as the primary caregiver is getting burned out.  I expressed this to him.    Also advised that they should consider a memory facility for the patient.    She will switch to Risperdal  twice a day , I did advise that she could take Ativan 0.5 mg half a tablet up to twice a day if needed for behavior.     MM- [name] is a 76 year old female with a history of Alzheimer's disease with behavioral disturbance.  She returns today for follow-up.  She is here today with her husband and son.  Her husband reports that she is still having behavioral disturbances and delusions.  She is convinced that her aide is having an affair with her husband.  Her husband reports that sometimes she is hard to manage.  He has been giving her Ativan up to twice a day even the prescription is written for once a day.  He also states that prior to  this appointment he was having trouble getting her to come and he gave her Ativan and another dose of Seroquel.  He states that there are some days that he gives her up to 4 doses of Seroquel.  Is currently prescribed 50 mg twice a day.  Son states that she tends to be most agitated with him.  She does go to a day center twice a week and does well there.  When her aide is there she does well with her.  Most of her agitation is directed at her husband.  [name] is a 76 year old female with a history of Alzheimer's disease with behavioral disturbance.  She returns today for follow-up.  Her husband is with her.  He feels that her symptoms have worsened.  He has an aide that comes in 3 days a week and reports that the patient gets very agitated with her.  States that the patient accuses him of having an affair with her.  He has been dosing her Seroquel giving her 50 mg 3 times a day.  Per our records we prescribed 50 mg at bedtime.  He also reports that he had an old prescription of Ativan for the patient and has been giving her that when she has uncontrollable agitation.  He states that it typically works well.  She is continued on the Exelon patch however he  is considering discontinuing this medication.  They typically travel to [place] for the winter but he has not made a decision if he is going to go.   HISTORY 09/18/20:  [name] is a 75 year old female with a history of Alzheimer's disease with behavioral disturbance.  She returns today for follow-up.  She is here today with her husband and son.  Husband reports that she is getting harder to care for due to more confusion and behavioral issues.  This primarily occurs in the evening hours.  For example the patient sometimes wants to cook and go buy food.  The husband refuses as he knows that she will not be able to do it.  This sometimes causes the patient to become aggravated.  The patient is currently on Seroquel 25 mg at bedtime.  She remains on  Exelon patch 9.5 mg.  She is able to complete all ADLs independently.  She does not operate a motor vehicle.  Her husband helps manage her medications and appointments.  REVIEW OF SYSTEMS: Out of a complete 14 system review of symptoms, the patient complains only of the following symptoms, and all other reviewed systems are negative.  See HPI  ALLERGIES: Allergies  Allergen Reactions   Ibuprofen Nausea And Vomiting    HOME MEDICATIONS: Outpatient Medications Prior to Visit  Medication Sig Dispense Refill   LORazepam (ATIVAN) 0.5 MG tablet Take 0.5 tablets (0.25 mg total) by mouth 2 (two) times daily as needed for anxiety. 30 tablet 4   Multiple Vitamin (MULTIVITAMIN) tablet Take 1 tablet by mouth daily.     omeprazole (PRILOSEC) 20 MG capsule Take 1 capsule (20 mg total) by mouth daily. 90 capsule 3   QUEtiapine (SEROQUEL) 50 MG tablet Take 1 tablet (50 mg total) by mouth 2 (two) times daily. 60 tablet 3   sertraline (ZOLOFT) 100 MG tablet Take 100 mg by mouth daily.     No facility-administered medications prior to visit.    PAST MEDICAL HISTORY: Past Medical History:  Diagnosis Date   Anxiety    Colon polyps    Depression    GERD (gastroesophageal reflux disease)    Hiatal hernia    Internal hemorrhoids    Tinnitus     PAST SURGICAL HISTORY: Past Surgical History:  Procedure Laterality Date   ABDOMINAL HYSTERECTOMY     APPENDECTOMY     CERVICAL SPINE SURGERY     knee surgery Right    MOUTH SURGERY     roof of mouth   TONSILLECTOMY     WISDOM TOOTH EXTRACTION      FAMILY HISTORY: Family History  Problem Relation Age of Onset   Colon cancer Neg Hx    Breast cancer Neg Hx    Rectal cancer Neg Hx    Stomach cancer Neg Hx    Esophageal cancer Neg Hx    Alzheimer's disease Neg Hx    Dementia Neg Hx     SOCIAL HISTORY: Social History   Socioeconomic History   Marital status: Married    Spouse name: Collier Salina   Number of children: 2   Years of education: hs    Highest education level: Not on file  Occupational History   Occupation: Retired  Tobacco Use   Smoking status: Never   Smokeless tobacco: Never  Vaping Use   Vaping Use: Never used  Substance and Sexual Activity   Alcohol use: Yes    Alcohol/week: 0.0 standard drinks    Comment: occ (glass of wine)  Drug use: No   Sexual activity: Yes    Birth control/protection: None  Other Topics Concern   Not on file  Social History Narrative   Right handed.   Caffeine 1-2 cups daily.  Married, 2 kids.  College.  Retired.     Social Determinants of Health   Financial Resource Strain: Not on file  Food Insecurity: Not on file  Transportation Needs: Not on file  Physical Activity: Not on file  Stress: Not on file  Social Connections: Not on file  Intimate Partner Violence: Not on file      PHYSICAL EXAM  Vitals:   05/13/21 1310  BP: 130/71  Pulse: 79  Weight: 157 lb (71.2 kg)  Height: 5\' 2"  (1.575 m)   Body mass index is 28.72 kg/m. MMSE - Mini Mental State Exam 02/21/2021 09/18/2020 02/12/2020  Orientation to time 1 0 3  Orientation to Place 1 5 5   Registration 3 3 3   Attention/ Calculation 0 0 1  Attention/Calculation-comments - - -  Recall 0 0 0  Language- name 2 objects 2 2 2   Language- repeat 0 1 1  Language- follow 3 step command 2 3 3   Language- read & follow direction 0 1 0  Write a sentence 1 0 1  Copy design 0 0 0  Total score 10 15 19      Generalized: Well developed, in no acute distress   Neurological examination  Mentation: Drowsy.  Follows all commands speech and language fluent Cranial nerve II-XII: Pupils were equal round reactive to light. Extraocular movements were full, visual field were full on confrontational test. Facial sensation and strength were normal. Uvula tongue midline. Head turning and shoulder shrug  were normal and symmetric. Motor: The motor testing reveals 5 over 5 strength of all 4 extremities. Good symmetric motor tone is noted  throughout.  Sensory: Sensory testing is intact to soft touch on all 4 extremities. No evidence of extinction is noted.  Coordination: Cerebellar testing reveals good finger-nose-finger and heel-to-shin bilaterally.  Difficulty with comprehending finger-nose-finger Gait and station: Gait is normal.  Reflexes: Deep tendon reflexes are symmetric and normal bilaterally.   DIAGNOSTIC DATA (LABS, IMAGING, TESTING) - I reviewed patient records, labs, notes, testing and imaging myself where available.  Lab Results  Component Value Date   WBC 4.8 08/09/2015   HGB 13.6 08/09/2015   HCT 41.4 08/09/2015   MCV 81.9 08/09/2015   PLT 361.0 08/09/2015      Component Value Date/Time   NA 137 01/05/2014 1303   K 5.1 01/05/2014 1303   CL 100 01/05/2014 1303   CO2 28 01/05/2014 1303   GLUCOSE 95 01/05/2014 1303   GLUCOSE 80 09/17/2006 1233   BUN 14 01/05/2014 1303   CREATININE 0.79 01/05/2014 1303   CALCIUM 10.3 01/05/2014 1303   PROT 7.3 01/05/2014 1303   ALBUMIN 4.7 01/05/2014 1303   AST 22 01/05/2014 1303   ALT 24 01/05/2014 1303   ALKPHOS 74 01/05/2014 1303   BILITOT 0.2 01/05/2014 1303   GFRNONAA 78 01/05/2014 1303   GFRAA 90 01/05/2014 1303      ASSESSMENT AND PLAN 76 y.o. year old female  has a past medical history of Anxiety, Colon polyps, Depression, GERD (gastroesophageal reflux disease), Hiatal hernia, Internal hemorrhoids, and Tinnitus. here with:  Hallucinations and sun downing, restlessly repeating activities- taking items out of drawer and placed back and repeat- accusing the cleaner or caretaker of stealing.  She has trouble using hot and cold water  in the shower- I suggested a scolding protector from home improvement store.  Also protect valuables from being discarded by Shirlee Limerick, using a safe.   She is agitated by 3.30 PM and often aggressive.  [facility] and enjoys this 3 times a week. There she is occupied and not as agitated at all, the day ends at  5.30 PM.  I suggested to find another activity for the remaining 2 days or expand the [facility] program to 5  days.   Alzheimer's Disease with severe behavior changes. Agitation, Paranoid hallucination.    I had a long discussion with the patient's husband and son.  I explained the importance of taking the medication as prescribed.    I also feel that her husband as the primary caregiver is getting burned out.  I expressed this to him.    Also advised that they should consider a memory facility for the patient.    She will switch to Risperdal  twice a day , I did advise that she could take Ativan 0.5 mg half a tablet up to twice a day if needed for behavior.   The patient's family will discuss their options for her ongoing care.    I spent 45 minutes of face-to-face and non-face-to-face time with patient, husband and son-.  Most of this time was spent discussing the patient's agitation, behavioral concerns and medication management.    Larey Seat, MD  05/13/2021, 1:47 PM [facility] [address], [place], [place] [address] [phone]

## 2021-05-15 ENCOUNTER — Telehealth: Payer: Self-pay | Admitting: Neurology

## 2021-05-15 NOTE — Telephone Encounter (Signed)
LVM returning call.  Reviewed note. On 05/13/21 (see phone note from 04/29/21), Dr. Brett Fairy d/c'd seroquel since ineffective and changed to risperidone 0.5mg , directions: Twice a day po, can use a third dose for bedtime if needed.  Can also continue sertraline as prescribed.

## 2021-05-15 NOTE — Telephone Encounter (Signed)
Took call from phone staff and spoke w/ husband. Relayed below info. He verbalized understanding.

## 2021-05-15 NOTE — Telephone Encounter (Signed)
Pt's husband, [name] would like a call from the nurse. Discuss if can continue taking Seroquel and Sertraline the same way.

## 2021-06-03 DIAGNOSIS — J40 Bronchitis, not specified as acute or chronic: Secondary | ICD-10-CM | POA: Diagnosis not present

## 2021-06-04 DIAGNOSIS — Z1152 Encounter for screening for COVID-19: Secondary | ICD-10-CM | POA: Diagnosis not present

## 2021-06-12 ENCOUNTER — Telehealth: Payer: Self-pay | Admitting: Neurology

## 2021-06-12 MED ORDER — RISPERIDONE 1 MG PO TABS: 1.0000 mg | ORAL_TABLET | Freq: Three times a day (TID) | ORAL | 0 refills | Status: DC

## 2021-06-12 NOTE — Addendum Note (Signed)
Addended by: Darleen Crocker on: 06/12/2021 04:04 PM ? ? Modules accepted: Orders ? ?

## 2021-06-12 NOTE — Telephone Encounter (Signed)
Pt's husband has called to report that pt needs a stronger dose of risperiDONE (RISPERDAL) 0.5 MG tablet because current dose isnt helping  ?

## 2021-06-12 NOTE — Telephone Encounter (Signed)
Pt returned call and the phone staff advised the husband my question. [name] states that the patient is taking as prescribed but the pt is still continuing to have hallucinations and is asking what the next steps are. Should dose be increased? I will discuss with Dr Brett Fairy and get her thoughts. ?

## 2021-06-12 NOTE — Telephone Encounter (Signed)
Called the husband, there was no answer. LVM asking for a call back to get more information. ? ?**If husband calls back, I wanted to find out more information on why he feels this medication isn't working and make sure that she is taking 1 tablet twice a day and using the 3rd tablet at bedtime if needed. If doing the 3 doses and not feeling like it is helpful, I will discuss with Dr Brett Fairy about if dose can be increased. ?

## 2021-06-12 NOTE — Telephone Encounter (Signed)
Dr. Brett Fairy is ok with increasing to 1 mg TID. I have corrected the script and forwarded to the pharmacy to reflect this change. Husband had just picked up today the 0.5 mg tablet. Advised that she will need to take 2 tablets up to TID to equal the 1 mg strength. Advised the correct script will be on file when pt is due for pick up. Pt's husband verbalized understanding. ? ?

## 2021-08-17 ENCOUNTER — Other Ambulatory Visit: Payer: Self-pay | Admitting: Neurology

## 2021-08-20 ENCOUNTER — Ambulatory Visit: Payer: Medicare HMO | Admitting: Neurology

## 2021-08-27 ENCOUNTER — Ambulatory Visit: Payer: Medicare HMO | Admitting: Adult Health

## 2021-09-17 ENCOUNTER — Other Ambulatory Visit: Payer: Self-pay | Admitting: Gastroenterology

## 2021-09-18 ENCOUNTER — Ambulatory Visit: Payer: Medicare HMO | Admitting: Adult Health

## 2021-09-18 VITALS — BP 155/82 | HR 73 | Ht 63.0 in | Wt 161.2 lb

## 2021-09-18 DIAGNOSIS — F0280 Dementia in other diseases classified elsewhere without behavioral disturbance: Secondary | ICD-10-CM

## 2021-09-18 DIAGNOSIS — G309 Alzheimer's disease, unspecified: Secondary | ICD-10-CM

## 2021-09-18 MED ORDER — RISPERIDONE 1 MG PO TABS: 1.0000 mg | ORAL_TABLET | Freq: Two times a day (BID) | ORAL | 11 refills | Status: DC

## 2021-09-18 NOTE — Patient Instructions (Signed)
Your Plan:  Continue Risperadal 1 mg twice day Use ativan if needed.   Thank you for coming to see [place] at [facility]. I hope we have been able to provide you high quality care today.  You may receive a patient satisfaction survey over the next few weeks. We would appreciate your feedback and comments so that we may continue to improve ourselves and the health of our patients.

## 2021-09-18 NOTE — Progress Notes (Signed)
PATIENT: [name] DOB: 07-Nov-1945  REASON FOR VISIT: follow up HISTORY FROM: patient  Chief Complaint  Patient presents with   Follow-up    Pt reports feeling fine. Room 19, with husband, husbands wants son to come in. While talking.      HISTORY OF PRESENT ILLNESS: Today 09/18/21: Ms. [name] is a 76 year old female with a history of Alzheimer's disease with behavioral disturbance.  She returns today for follow-up. Here today with her husband and Son. Behavior is better. No melt downs in the afternoon like usual. Still some behavioral issues. Not having to use ativan in the last month. Son is now managing medication. They have an aide that comes in to assist with ADLs. [facility]- all day. Some trouble getting her to get in the bed. No trouble falling asleep once in bed. Son thinking about trying melatonin   03/31/21 Ms. [name] is a 76 year old female with a history of Alzheimer's disease with behavioral disturbance.  She returns today for follow-up.  She is here today with her husband and son.  Her husband reports that she is still having behavioral disturbances and delusions.  She is convinced that her aide is having an affair with her husband.  Her husband reports that sometimes she is hard to manage.  He has been giving her Ativan up to twice a day even the prescription is written for once a day.  He also states that prior to this appointment he was having trouble getting her to come and he gave her Ativan and another dose of Seroquel.  He states that there are some days that he gives her up to 4 doses of Seroquel.  Is currently prescribed 50 mg twice a day.  Son states that she tends to be most agitated with him.  She does go to a day center twice a week and does well there.  When her aide is there she does well with her.  Most of her agitation is directed at her husband.  Ms. [name] is a 76 year old female with a history of Alzheimer's disease with behavioral  disturbance.  She returns today for follow-up.  Her husband is with her.  He feels that her symptoms have worsened.  He has an aide that comes in 3 days a week and reports that the patient gets very agitated with her.  States that the patient accuses him of having an affair with her.  He has been dosing her Seroquel giving her 50 mg 3 times a day.  Per our records we prescribed 50 mg at bedtime.  He also reports that he had an old prescription of Ativan for the patient and has been giving her that when she has uncontrollable agitation.  He states that it typically works well.  She is continued on the Exelon patch however he is considering discontinuing this medication.  They typically travel to [place] for the winter but he has not made a decision if he is going to go.   HISTORY 09/18/20:  Ms. [name] is a 76 year old female with a history of Alzheimer's disease with behavioral disturbance.  She returns today for follow-up.  She is here today with her husband and son.  Husband reports that she is getting harder to care for due to more confusion and behavioral issues.  This primarily occurs in the evening hours.  For example the patient sometimes wants to cook and go buy food.  The husband refuses as he knows that she will not  be able to do it.  This sometimes causes the patient to become aggravated.  The patient is currently on Seroquel 25 mg at bedtime.  She remains on Exelon patch 9.5 mg.  She is able to complete all ADLs independently.  She does not operate a motor vehicle.  Her husband helps manage her medications and appointments.  REVIEW OF SYSTEMS: Out of a complete 14 system review of symptoms, the patient complains only of the following symptoms, and all other reviewed systems are negative.  See HPI  ALLERGIES: Allergies  Allergen Reactions   Ibuprofen Nausea And Vomiting    HOME MEDICATIONS: Outpatient Medications Prior to Visit  Medication Sig Dispense Refill   LORazepam (ATIVAN) 0.5 MG  tablet Take 0.5 tablets (0.25 mg total) by mouth 2 (two) times daily as needed for anxiety. 30 tablet 4   Multiple Vitamin (MULTIVITAMIN) tablet Take 1 tablet by mouth daily.     omeprazole (PRILOSEC) 20 MG capsule Take 1 capsule (20 mg total) by mouth daily. **PLEASE CALL OFFICE TO SCHEDULE FOLLOW UP 90 capsule 0   risperiDONE (RISPERDAL) 1 MG tablet TAKE 1 TABLET BY MOUTH THREE TIMES DAILY 90 tablet 0   sertraline (ZOLOFT) 100 MG tablet Take 100 mg by mouth daily.     No facility-administered medications prior to visit.    PAST MEDICAL HISTORY: Past Medical History:  Diagnosis Date   Anxiety    Colon polyps    Depression    GERD (gastroesophageal reflux disease)    Hiatal hernia    Internal hemorrhoids    Tinnitus     PAST SURGICAL HISTORY: Past Surgical History:  Procedure Laterality Date   ABDOMINAL HYSTERECTOMY     APPENDECTOMY     CERVICAL SPINE SURGERY     knee surgery Right    MOUTH SURGERY     roof of mouth   TONSILLECTOMY     WISDOM TOOTH EXTRACTION      FAMILY HISTORY: Family History  Problem Relation Age of Onset   Colon cancer Neg Hx    Breast cancer Neg Hx    Rectal cancer Neg Hx    Stomach cancer Neg Hx    Esophageal cancer Neg Hx    Alzheimer's disease Neg Hx    Dementia Neg Hx     SOCIAL HISTORY: Social History   Socioeconomic History   Marital status: Married    Spouse name: Collier Salina   Number of children: 2   Years of education: hs   Highest education level: Not on file  Occupational History   Occupation: Retired  Tobacco Use   Smoking status: Never   Smokeless tobacco: Never  Vaping Use   Vaping Use: Never used  Substance and Sexual Activity   Alcohol use: Yes    Alcohol/week: 0.0 standard drinks of alcohol    Comment: occ (glass of wine)   Drug use: No   Sexual activity: Yes    Birth control/protection: None  Other Topics Concern   Not on file  Social History Narrative   Right handed.   Caffeine 1-2 cups daily.  Married, 2 kids.   College.  Retired.     Social Determinants of Health   Financial Resource Strain: Not on file  Food Insecurity: Not on file  Transportation Needs: Not on file  Physical Activity: Not on file  Stress: Not on file  Social Connections: Not on file  Intimate Partner Violence: Not on file      PHYSICAL EXAM  There were  no vitals filed for this visit.  There is no height or weight on file to calculate BMI.    02/21/2021   10:43 AM 09/18/2020    2:13 PM 02/12/2020    1:31 PM  MMSE - Mini Mental State Exam  Orientation to time 1 0 3  Orientation to Place '1 5 5  '$ Registration '3 3 3  '$ Attention/ Calculation 0 0 1  Recall 0 0 0  Language- name 2 objects '2 2 2  '$ Language- repeat 0 1 1  Language- follow 3 step command '2 3 3  '$ Language- read & follow direction 0 1 0  Write a sentence 1 0 1  Copy design 0 0 0  Total score '10 15 19     '$ Generalized: Well developed, in no acute distress   Neurological examination  Mentation: Alert.  Follows all commands speech and language fluent Cranial nerve II-XII: Pupils were equal round reactive to light. Extraocular movements were full, visual field were full on confrontational test. Facial sensation and strength were normal.  Head turning and shoulder shrug  were normal and symmetric. Motor: The motor testing reveals 5 over 5 strength of all 4 extremities. Good symmetric motor tone is noted throughout.  Sensory: Sensory testing is intact to soft touch on all 4 extremities. No evidence of extinction is noted.  Coordination: Cerebellar testing reveals good finger-nose-finger and heel-to-shin bilaterally.   Gait and station: Gait is normal.    DIAGNOSTIC DATA (LABS, IMAGING, TESTING) - I reviewed patient records, labs, notes, testing and imaging myself where available.  Lab Results  Component Value Date   WBC 4.8 08/09/2015   HGB 13.6 08/09/2015   HCT 41.4 08/09/2015   MCV 81.9 08/09/2015   PLT 361.0 08/09/2015      Component Value  Date/Time   NA 137 01/05/2014 1303   K 5.1 01/05/2014 1303   CL 100 01/05/2014 1303   CO2 28 01/05/2014 1303   GLUCOSE 95 01/05/2014 1303   GLUCOSE 80 09/17/2006 1233   BUN 14 01/05/2014 1303   CREATININE 0.79 01/05/2014 1303   CALCIUM 10.3 01/05/2014 1303   PROT 7.3 01/05/2014 1303   ALBUMIN 4.7 01/05/2014 1303   AST 22 01/05/2014 1303   ALT 24 01/05/2014 1303   ALKPHOS 74 01/05/2014 1303   BILITOT 0.2 01/05/2014 1303   GFRNONAA 78 01/05/2014 1303   GFRAA 90 01/05/2014 1303      ASSESSMENT AND PLAN 76 y.o. year old female  has a past medical history of Anxiety, Colon polyps, Depression, GERD (gastroesophageal reflux disease), Hiatal hernia, Internal hemorrhoids, and Tinnitus. here with:  Alzheimer's Disease with behavior disturbance  - Continue Risperdal 1 mg BID - Continue Ativan 0.5 mg BID PRN - Encouraged family to change environment at bedtime to promote sleep. This may help her to realize that its time to go to bed. No trouble falling asleep once in bed. Son is going to try low dose melatonin 1 mg as well. - FU  in 6 months or sooner if needed.       Ward Givens, MSN, NP-C 09/18/2021, 1:51 PM [facility] [address] [place], [place] [address] [phone]

## 2021-12-08 ENCOUNTER — Other Ambulatory Visit: Payer: Self-pay | Admitting: Family Medicine

## 2021-12-08 DIAGNOSIS — Z1231 Encounter for screening mammogram for malignant neoplasm of breast: Secondary | ICD-10-CM

## 2021-12-22 ENCOUNTER — Other Ambulatory Visit: Payer: Self-pay | Admitting: Adult Health

## 2021-12-23 NOTE — Telephone Encounter (Signed)
Last visit: 09/18/21 Next visit: 04/09/22  Per [place] registry, last filled on 08/11/2021 #30/30. Rx request sent to [facility] NP.

## 2021-12-30 DIAGNOSIS — R32 Unspecified urinary incontinence: Secondary | ICD-10-CM | POA: Diagnosis not present

## 2021-12-30 DIAGNOSIS — F02818 Dementia in other diseases classified elsewhere, unspecified severity, with other behavioral disturbance: Secondary | ICD-10-CM | POA: Diagnosis not present

## 2021-12-30 DIAGNOSIS — F321 Major depressive disorder, single episode, moderate: Secondary | ICD-10-CM | POA: Diagnosis not present

## 2021-12-30 DIAGNOSIS — R7303 Prediabetes: Secondary | ICD-10-CM | POA: Diagnosis not present

## 2021-12-30 DIAGNOSIS — Z86010 Personal history of colonic polyps: Secondary | ICD-10-CM | POA: Diagnosis not present

## 2021-12-30 DIAGNOSIS — G300 Alzheimer's disease with early onset: Secondary | ICD-10-CM | POA: Diagnosis not present

## 2021-12-30 DIAGNOSIS — G309 Alzheimer's disease, unspecified: Secondary | ICD-10-CM | POA: Diagnosis not present

## 2021-12-30 DIAGNOSIS — F0280 Dementia in other diseases classified elsewhere without behavioral disturbance: Secondary | ICD-10-CM | POA: Diagnosis not present

## 2021-12-30 DIAGNOSIS — D649 Anemia, unspecified: Secondary | ICD-10-CM | POA: Diagnosis not present

## 2022-01-05 DIAGNOSIS — Z119 Encounter for screening for infectious and parasitic diseases, unspecified: Secondary | ICD-10-CM | POA: Diagnosis not present

## 2022-01-07 ENCOUNTER — Ambulatory Visit
Admission: RE | Admit: 2022-01-07 | Discharge: 2022-01-07 | Disposition: A | Discharge status: Home / Self Care | Payer: Medicare HMO | Source: Ambulatory Visit | Attending: Family Medicine | Admitting: Family Medicine

## 2022-01-07 DIAGNOSIS — Z1231 Encounter for screening mammogram for malignant neoplasm of breast: Secondary | ICD-10-CM

## 2022-01-15 ENCOUNTER — Telehealth: Payer: Self-pay | Admitting: Adult Health

## 2022-01-15 NOTE — Telephone Encounter (Signed)
Pt husband is calling. Stating he needs an appointment to adjust pt medication.

## 2022-01-15 NOTE — Telephone Encounter (Signed)
Spoke with Husband (checked DPR)  Husband states patient is restless in evening when coming home from her day center Husband states pt gets home around 5pm and she is hard to settle down. Husband states pt current medication regiment is not working., Pt is on risperidone '1mg'$  2x daily,sertraline '100mg'$  daily and Ativan 0.5 MG Daily PRN . Husband wanted a sooner appointment to discuss a change in patients medication. Husband states pt does finally fall asleep and sleeps all night Made appointment 01/22/2022  Husband thanked me for calling .

## 2022-01-15 NOTE — Telephone Encounter (Signed)
LMVM for pt to return call.   

## 2022-01-19 ENCOUNTER — Telehealth: Payer: Self-pay | Admitting: Adult Health

## 2022-01-19 NOTE — Telephone Encounter (Signed)
I am going to be out of the office doing my CPR during that time. Will need to reschedule. Ok to schedule with Dr. Brett Fairy

## 2022-01-19 NOTE — Telephone Encounter (Addendum)
Spoke to husband (checked DPR) Pt already rescheduled for Nov 1 3pm  with Megan,NP

## 2022-01-19 NOTE — Telephone Encounter (Signed)
LVM and sent mychart msg informing pt of r/s needed for 11/2 appt- NP out. Held spot for her on 01/21/22 if she calls back.

## 2022-01-20 ENCOUNTER — Other Ambulatory Visit: Payer: Self-pay

## 2022-01-20 MED ORDER — OMEPRAZOLE 20 MG PO CPDR: 20.0000 mg | DELAYED_RELEASE_CAPSULE | Freq: Every day | ORAL | 0 refills | Status: DC

## 2022-01-20 NOTE — Progress Notes (Signed)
Refill request for omeprazole 20 mg to [facility]. Patient has an appointment with Nevin Bloodgood on 02-03-22

## 2022-01-21 ENCOUNTER — Encounter: Payer: Self-pay | Admitting: Adult Health

## 2022-01-21 ENCOUNTER — Ambulatory Visit: Payer: Medicare HMO | Admitting: Adult Health

## 2022-01-21 VITALS — BP 140/81 | HR 70 | Ht 62.0 in | Wt 159.0 lb

## 2022-01-21 DIAGNOSIS — G301 Alzheimer's disease with late onset: Secondary | ICD-10-CM

## 2022-01-21 DIAGNOSIS — F02818 Dementia in other diseases classified elsewhere, unspecified severity, with other behavioral disturbance: Secondary | ICD-10-CM | POA: Diagnosis not present

## 2022-01-21 MED ORDER — RISPERIDONE 1 MG PO TABS: 1.0000 mg | ORAL_TABLET | Freq: Every day | ORAL | 11 refills | Status: DC

## 2022-01-21 MED ORDER — SERTRALINE HCL 100 MG PO TABS: 150.0000 mg | ORAL_TABLET | Freq: Every day | ORAL | 5 refills | Status: DC

## 2022-01-21 NOTE — Progress Notes (Signed)
PATIENT: [name] DOB: Jun 07, 1945  REASON FOR VISIT: follow up HISTORY FROM: patient  Chief Complaint  Patient presents with   Rm 9    Here with husband for follow-up. Patient reports she feels ok. Her husband has concerns. She gets hallucinations, delusions in the evening. Sometimes they start when it goes down and sometimes it starts earlier.       HISTORY OF PRESENT ILLNESS: Today 01/21/22: [name] is a 76 year old female with a history of Alzheimer's dementia with behavioral disturbance.  She returns today for follow-up.  She is here today with her caregiver and her husband.  Her son is on the telephone listening to the visit.  Caregiver explains that in the afternoons when she comes home at 5 PM from [facility] she can sometimes be fidgety often want to go outside and sleep leaves or move objects in the house.  She states that this is very disturbing to her husband.  She also acknowledges that the patient does have episodes where she is more agitated sometimes can be combative.  Right now her husband is dosing her medication.  He gives her all of her medication when she comes home from [facility].  Is given her 2 mg of Risperdal, 200 mg of Zoloft and sometimes given her the Ativan.  The patient was taking Risperdal 1 mg twice a day but could not tolerate the morning dose as it was making her too sleepy while she was at [facility].  The husband is looking to get a caregiver to stay past 6 PM to help with bedtime routine.  May  09/18/21:[name] is a 76 year old female with a history of Alzheimer's disease with behavioral disturbance.  She returns today for follow-up. Here today with her husband and Son. Behavior is better. No melt downs in the afternoon like usual. Still some behavioral issues. Not having to use ativan in the last month. Son is now managing medication. They have an aide that comes in to assist with ADLs. Goes to [facility] daily- all day. Some trouble getting  her to get in the bed. No trouble falling asleep once in bed. Son thinking about trying melatonin   03/31/21 [name] is a 76 year old female with a history of Alzheimer's disease with behavioral disturbance.  She returns today for follow-up.  She is here today with her husband and son.  Her husband reports that she is still having behavioral disturbances and delusions.  She is convinced that her aide is having an affair with her husband.  Her husband reports that sometimes she is hard to manage.  He has been giving her Ativan up to twice a day even the prescription is written for once a day.  He also states that prior to this appointment he was having trouble getting her to come and he gave her Ativan and another dose of Seroquel.  He states that there are some days that he gives her up to 4 doses of Seroquel.  Is currently prescribed 50 mg twice a day.  Son states that she tends to be most agitated with him.  She does go to a day center twice a week and does well there.  When her aide is there she does well with her.  Most of her agitation is directed at her husband.  [name] is a 76 year old female with a history of Alzheimer's disease with behavioral disturbance.  She returns today for follow-up.  Her husband is with her.  He feels that her  symptoms have worsened.  He has an aide that comes in 3 days a week and reports that the patient gets very agitated with her.  States that the patient accuses him of having an affair with her.  He has been dosing her Seroquel giving her 50 mg 3 times a day.  Per our records we prescribed 50 mg at bedtime.  He also reports that he had an old prescription of Ativan for the patient and has been giving her that when she has uncontrollable agitation.  He states that it typically works well.  She is continued on the Exelon patch however he is considering discontinuing this medication.  They typically travel to [place] for the winter but he has not made a decision if he is  going to go.   HISTORY 09/18/20:  [name] is a 76 year old female with a history of Alzheimer's disease with behavioral disturbance.  She returns today for follow-up.  She is here today with her husband and son.  Husband reports that she is getting harder to care for due to more confusion and behavioral issues.  This primarily occurs in the evening hours.  For example the patient sometimes wants to cook and go buy food.  The husband refuses as he knows that she will not be able to do it.  This sometimes causes the patient to become aggravated.  The patient is currently on Seroquel 25 mg at bedtime.  She remains on Exelon patch 9.5 mg.  She is able to complete all ADLs independently.  She does not operate a motor vehicle.  Her husband helps manage her medications and appointments.  REVIEW OF SYSTEMS: Out of a complete 14 system review of symptoms, the patient complains only of the following symptoms, and all other reviewed systems are negative.  See HPI  ALLERGIES: Allergies  Allergen Reactions   Ibuprofen Nausea And Vomiting    HOME MEDICATIONS: Outpatient Medications Prior to Visit  Medication Sig Dispense Refill   Ferrous Sulfate (IRON PO) Take 2 tablets by mouth daily.     LORazepam (ATIVAN) 0.5 MG tablet TAKE 1 TABLET BY MOUTH ONCE DAILY AS NEEDED FOR ANXIETY 30 tablet 2   Multiple Vitamin (MULTIVITAMIN) tablet Take 1 tablet by mouth daily.     omeprazole (PRILOSEC) 20 MG capsule Take 1 capsule (20 mg total) by mouth daily. Please keep your November appointment for further refills. Thank you. 30 capsule 0   risperiDONE (RISPERDAL) 1 MG tablet Take 1 tablet (1 mg total) by mouth 2 (two) times daily. 60 tablet 11   sertraline (ZOLOFT) 100 MG tablet Take 200 mg by mouth at bedtime.     No facility-administered medications prior to visit.    PAST MEDICAL HISTORY: Past Medical History:  Diagnosis Date   Anxiety    Colon polyps    Depression    GERD (gastroesophageal reflux  disease)    Hiatal hernia    Internal hemorrhoids    Tinnitus     PAST SURGICAL HISTORY: Past Surgical History:  Procedure Laterality Date   ABDOMINAL HYSTERECTOMY     APPENDECTOMY     CERVICAL SPINE SURGERY     knee surgery Right    MOUTH SURGERY     roof of mouth   TONSILLECTOMY     WISDOM TOOTH EXTRACTION      FAMILY HISTORY: Family History  Problem Relation Age of Onset   Colon cancer Neg Hx    Breast cancer Neg Hx    Rectal cancer  Neg Hx    Stomach cancer Neg Hx    Esophageal cancer Neg Hx    Alzheimer's disease Neg Hx    Dementia Neg Hx     SOCIAL HISTORY: Social History   Socioeconomic History   Marital status: Married    Spouse name: Collier Salina   Number of children: 2   Years of education: hs   Highest education level: Not on file  Occupational History   Occupation: Retired  Tobacco Use   Smoking status: Never   Smokeless tobacco: Never  Vaping Use   Vaping Use: Never used  Substance and Sexual Activity   Alcohol use: Yes    Alcohol/week: 0.0 standard drinks of alcohol    Comment: occ (glass of wine)   Drug use: No   Sexual activity: Yes    Birth control/protection: None  Other Topics Concern   Not on file  Social History Narrative   Right handed.   Caffeine 1/2 cup in the morning.  Married, 2 kids.  College.  Retired.     Social Determinants of Health   Financial Resource Strain: Not on file  Food Insecurity: Not on file  Transportation Needs: Not on file  Physical Activity: Not on file  Stress: Not on file  Social Connections: Not on file  Intimate Partner Violence: Not on file      PHYSICAL EXAM  Vitals:   01/21/22 1453  BP: (!) 140/81  Pulse: 70  Weight: 159 lb (72.1 kg)  Height: '5\' 2"'$  (1.575 m)    Body mass index is 29.08 kg/m.    02/21/2021   10:43 AM 09/18/2020    2:13 PM 02/12/2020    1:31 PM  MMSE - Mini Mental State Exam  Orientation to time 1 0 3  Orientation to Place '1 5 5  '$ Registration '3 3 3  '$ Attention/  Calculation 0 0 1  Recall 0 0 0  Language- name 2 objects '2 2 2  '$ Language- repeat 0 1 1  Language- follow 3 step command '2 3 3  '$ Language- read & follow direction 0 1 0  Write a sentence 1 0 1  Copy design 0 0 0  Total score '10 15 19     '$ Generalized: Well developed, in no acute distress   Neurological examination  Mentation: Alert.  Follows all commands speech and language fluent Cranial nerve II-XII: Pupils were equal round reactive to light. Extraocular movements were full, visual field were full on confrontational test. Facial sensation and strength were normal.  Head turning and shoulder shrug  were normal and symmetric. Motor: The motor testing reveals 5 over 5 strength of all 4 extremities. Good symmetric motor tone is noted throughout.  Gait and station: Gait is normal.    DIAGNOSTIC DATA (LABS, IMAGING, TESTING) - I reviewed patient records, labs, notes, testing and imaging myself where available.  Lab Results  Component Value Date   WBC 4.8 08/09/2015   HGB 13.6 08/09/2015   HCT 41.4 08/09/2015   MCV 81.9 08/09/2015   PLT 361.0 08/09/2015      Component Value Date/Time   NA 137 01/05/2014 1303   K 5.1 01/05/2014 1303   CL 100 01/05/2014 1303   CO2 28 01/05/2014 1303   GLUCOSE 95 01/05/2014 1303   GLUCOSE 80 09/17/2006 1233   BUN 14 01/05/2014 1303   CREATININE 0.79 01/05/2014 1303   CALCIUM 10.3 01/05/2014 1303   PROT 7.3 01/05/2014 1303   ALBUMIN 4.7 01/05/2014 1303   AST  22 01/05/2014 1303   ALT 24 01/05/2014 1303   ALKPHOS 74 01/05/2014 1303   BILITOT 0.2 01/05/2014 1303   GFRNONAA 78 01/05/2014 1303   GFRAA 90 01/05/2014 1303      ASSESSMENT AND PLAN 76 y.o. year old female  has a past medical history of Anxiety, Colon polyps, Depression, GERD (gastroesophageal reflux disease), Hiatal hernia, Internal hemorrhoids, and Tinnitus. here with:  Alzheimer's Disease with behavior disturbance  - Reduce Risperdal 1 mg at 5:30 - Reduce zoloft to 150 mg  at bedtime this was originally prescribed by PCP for hot flashes per husband - Continue Ativan 0.5 mg daily PRN -Encouraged the family to use a pillbox- suggested that the caregiver  help fill the pillbox.  It seems that sometimes the patient's husband does not give the medication correctly - FU  in 3-4 months or sooner if needed.       Ward Givens, MSN, NP-C 01/21/2022, 3:09 PM [facility] [address] [place],  [address] [phone]

## 2022-01-21 NOTE — Patient Instructions (Addendum)
Your Plan:  Give her Risperadal 1 mg when she comes home from [facility] to 150 mg to be given before bedtime Give ativan 0.5 mg for breakthrough hallucinations/agitation or combativeness  If your symptoms worsen or you develop new symptoms please let us know.    hank you for coming to see [place] at [facility]. I hope we have been able to provide you high quality care today.  You may receive a patient satisfaction survey over the next few weeks. We would appreciate your feedback and comments so that we may continue to improve ourselves and the health of our patients.

## 2022-01-22 ENCOUNTER — Ambulatory Visit: Payer: Medicare HMO | Admitting: Adult Health

## 2022-01-27 DIAGNOSIS — D2372 Other benign neoplasm of skin of left lower limb, including hip: Secondary | ICD-10-CM | POA: Diagnosis not present

## 2022-01-27 DIAGNOSIS — D2221 Melanocytic nevi of right ear and external auricular canal: Secondary | ICD-10-CM | POA: Diagnosis not present

## 2022-01-27 DIAGNOSIS — L821 Other seborrheic keratosis: Secondary | ICD-10-CM | POA: Diagnosis not present

## 2022-01-27 DIAGNOSIS — D2271 Melanocytic nevi of right lower limb, including hip: Secondary | ICD-10-CM | POA: Diagnosis not present

## 2022-01-27 DIAGNOSIS — D2262 Melanocytic nevi of left upper limb, including shoulder: Secondary | ICD-10-CM | POA: Diagnosis not present

## 2022-01-27 DIAGNOSIS — D1801 Hemangioma of skin and subcutaneous tissue: Secondary | ICD-10-CM | POA: Diagnosis not present

## 2022-02-03 ENCOUNTER — Encounter: Payer: Self-pay | Admitting: Nurse Practitioner

## 2022-02-03 ENCOUNTER — Telehealth: Payer: Self-pay

## 2022-02-03 ENCOUNTER — Ambulatory Visit: Payer: Medicare HMO | Admitting: Nurse Practitioner

## 2022-02-03 VITALS — BP 130/80 | HR 62 | Ht 63.0 in | Wt 158.2 lb

## 2022-02-03 DIAGNOSIS — D509 Iron deficiency anemia, unspecified: Secondary | ICD-10-CM | POA: Diagnosis not present

## 2022-02-03 NOTE — Telephone Encounter (Signed)
Patient is going to call back to schedule Endo/colon. Patient needs to be scheduled soon within the next few weeks. If there is no openings with Dr.Armbruster please schedule with another provider that has an opening.

## 2022-02-03 NOTE — Progress Notes (Signed)
Agree with assessment and plan as outlined.  

## 2022-02-03 NOTE — Progress Notes (Signed)
. 333   Chief Complaint:  new anemia   Assessment &  Plan   # 76 yo female with unexplained iron deficiency anemia. Hgb 12 in Dec 2022 >> 8.7 over the last several months. No overt GI bleeding. DDx: occult GI bleeding from PUD, AVMs, neoplasm ( less likely), development of  Cameron's lesions with bleeding?  Celiac disease ( unlikely)? PCP started oral iron a few weeks ago.  Follow up cbc today to make sure hgb stable.  Schedule for EGD and colonoscopy. The risks and benefits of EGD and colonoscopy with possible polypectomy / biopsies were discussed and the patient agrees to proceed.  Hold iron for 7 days prior to procedures If iron causes constipation, try a capful of miralax in 8 oz of water daily Obtain tTg, IgA  #History of colon polyps, surveillance colonoscopy was actually due October 20272   HPI   76 year old female known to [name] with a past medical history of Alzheimer's, colon polyps, GERD with esophagitis, hiatal hernia, depression . See PMH /PSH for additional history   [name] was last seen June 2022.  At that time she was doing well on low-dose of omeprazole.  She has a history of colon polyps, it was noted that she was due for surveillance colonoscopy October 2023.   Interval History:  Patient is being seen at request of her PCP for evaluation of anemia.  Labs in care everywhere reviewed. In December 2022 her hemoglobin was 12 with borderline MCV of 79.  Labs on 12/30/2021 showed a hemoglobin of 8.7, MCV 69, elevated TIBC with low percent saturation  She hasn't seen in blood in stool. No black stools. She doesn't donate blood. No epistaxis, blood in urine, or vaginal bleeding. No recent surgeries. No NSAID. Use.  She has no GI symptoms other than occasional heartburn for which she takes a PPI as needed.   PCP has started her on BID oral iron.    Previous GI Evaluation   EGD 07/2016 - 5cm HH, mild esophagitis, benign gastric hyperplastic polyp with focal  goblet cell metaplasia   Colonoscopy 12/28/18 - The perianal and digital rectal examinations were normal. - The colon was tortuous. - A 5 mm polyp was found in the ascending colon. The polyp was sessile. The polyp was removed with a cold snare. Resection and retrieval were complete. - A 4 mm polyp was found in the hepatic flexure. The polyp was flat. The polyp was removed with a cold snare. Resection and retrieval were complete. - Two sessile polyps were found in the sigmoid colon. The polyps were 4 mm in size. These polyps were removed with a cold snare. Resection and retrieval were complete. - A few small-mouthed diverticula were found in the sigmoid colon. - Internal hemorrhoids were found during retroflexion. The hemorrhoids were moderate. - The exam was otherwise without abnormality.   Surgical [P], colon, hepatic flexure and ascending and sigmoid-2, polyp (4) - SESSILE SERRATED POLYP WITHOUT CYTOLOGIC DYSPLASIA (X MULTIPLE FRAGMENTS). 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    Past Medical History:  Diagnosis Date   Anxiety    Colon polyps    Depression    GERD (gastroesophageal reflux disease)    Hiatal hernia    Internal hemorrhoids    Tinnitus     Past Surgical History:  Procedure Laterality Date   ABDOMINAL HYSTERECTOMY     APPENDECTOMY     CERVICAL SPINE SURGERY     knee surgery Right  MOUTH SURGERY     roof of mouth   TONSILLECTOMY     WISDOM TOOTH EXTRACTION      Current Medications, Allergies, Family History and Social History were reviewed in [employer] record.     Current Outpatient Medications  Medication Sig Dispense Refill   Ferrous Sulfate (IRON PO) Take 2 tablets by mouth daily.     LORazepam (ATIVAN) 0.5 MG tablet TAKE 1 TABLET BY MOUTH ONCE DAILY AS NEEDED FOR ANXIETY 30 tablet 2   Multiple Vitamin (MULTIVITAMIN) tablet Take 1 tablet by mouth daily.     omeprazole (PRILOSEC) 20 MG capsule  Take 1 capsule (20 mg total) by mouth daily. Please keep your November appointment for further refills. Thank you. 30 capsule 0   risperiDONE (RISPERDAL) 1 MG tablet Take 1 tablet (1 mg total) by mouth at bedtime. 30 tablet 11   sertraline (ZOLOFT) 100 MG tablet Take 1.5 tablets (150 mg total) by mouth at bedtime. 45 tablet 5   No current facility-administered medications for this visit.    Review of Systems: No chest pain. No shortness of breath. No urinary complaints.    Physical Exam  Wt Readings from Last 3 Encounters:  01/21/22 159 lb (72.1 kg)  09/18/21 161 lb 4 oz (73.1 kg)  05/13/21 157 lb (71.2 kg)    BP 130/80 (BP Location: Left Arm, Patient Position: Sitting, Cuff Size: Normal)   Pulse 62   Ht '5\' 3"'$  (1.6 m)   Wt 158 lb 3.2 oz (71.8 kg)   SpO2 96%   BMI 28.02 kg/m  Constitutional:  Generally well appearing female in no acute distress. Psychiatric: Pleasant. Normal mood and affect. Behavior is normal. EENT: Pupils normal.  Conjunctivae are normal. No scleral icterus. Neck supple.  Cardiovascular: Normal rate, regular rhythm. No edema Pulmonary/chest: Effort normal and breath sounds normal. No wheezing, rales or rhonchi. Abdominal: Soft, nondistended, nontender. Bowel sounds active throughout. There are no masses palpable. No hepatomegaly. Neurological: Alert and oriented to person place and time. Skin: Skin is warm and dry. No rashes noted.  [name]  02/03/2022, 8:31 AM  Cc:  Amy Mires, MD

## 2022-02-03 NOTE — Patient Instructions (Addendum)
If you are age 74 or older, your body mass index should be between 23-30. Your Body mass index is 28.02 kg/m. If this is out of the aforementioned range listed, please consider follow up with your Primary Care Provider.  If you are age 76 or younger, your body mass index should be between 19-25. Your Body mass index is 28.02 kg/m. If this is out of the aformentioned range listed, please consider follow up with your Primary Care Provider.   The [facility] providers would like to encourage you to use [facility] to communicate with providers for non-urgent requests or questions.  Due to long hold times on the telephone, sending your provider a message by [facility] may be a faster and more efficient way to get a response.  Please allow 48 business hours for a response.  Please remember that this is for non-urgent requests.   Your provider has requested that you go to the basement level for lab work before leaving today. Press "B" on the elevator. The lab is located at the first door on the left as you exit the elevator.   Hold iron for 7 days prior to procedures.  If iron causes constipation, try a capful of miralax in 8 oz of water daily  It has been recommended to you by your physician that you have a(n) EGD and Colonoscopy  completed. Per your request, we did not schedule the procedure(s) today. Please contact our office at [phone] should you decide to have the procedure completed. You will be scheduled for a pre-visit and procedure at that time.  It was a pleasure to see you today!  Thank you for trusting me with your gastrointestinal care!

## 2022-02-05 ENCOUNTER — Other Ambulatory Visit (INDEPENDENT_AMBULATORY_CARE_PROVIDER_SITE_OTHER): Payer: Medicare HMO

## 2022-02-05 DIAGNOSIS — D509 Iron deficiency anemia, unspecified: Secondary | ICD-10-CM

## 2022-02-05 LAB — CBC
HCT: 36.2 % (ref 36.0–46.0)
Hemoglobin: 11.3 g/dL — ABNORMAL LOW (ref 12.0–15.0)
MCHC: 31.1 g/dL (ref 30.0–36.0)
MCV: 71.6 fl — ABNORMAL LOW (ref 78.0–100.0)
Platelets: 340 10*3/uL (ref 150.0–400.0)
RBC: 5.05 Mil/uL (ref 3.87–5.11)
RDW: 24.4 % — ABNORMAL HIGH (ref 11.5–15.5)
WBC: 4.3 10*3/uL (ref 4.0–10.5)

## 2022-02-06 LAB — TISSUE TRANSGLUTAMINASE, IGA: (tTG) Ab, IgA: <1 U/mL

## 2022-02-06 LAB — IGA: Immunoglobulin A: 163 mg/dL (ref 70–320)

## 2022-02-10 ENCOUNTER — Telehealth: Payer: Self-pay | Admitting: Nurse Practitioner

## 2022-02-10 NOTE — Telephone Encounter (Signed)
Patients husband called to schedule patient for her endoscopy and colonoscopy. Patient was scheduled for 12/20 at 4:00 with Dr. Silverio Decamp due to Dr. Havery Moros not having the time patient wanted on 12/20. Patient was last seen on 11/14 with Nevin Bloodgood and needs prep instructions. Please advise.

## 2022-02-11 DIAGNOSIS — K219 Gastro-esophageal reflux disease without esophagitis: Secondary | ICD-10-CM

## 2022-02-11 DIAGNOSIS — Z860100 Personal history of colon polyps, unspecified: Secondary | ICD-10-CM

## 2022-02-11 DIAGNOSIS — D509 Iron deficiency anemia, unspecified: Secondary | ICD-10-CM

## 2022-02-11 MED ORDER — NA SULFATE-K SULFATE-MG SULF 17.5-3.13-1.6 GM/177ML PO SOLN: 1.0000 | Freq: Once | ORAL | 0 refills | Status: AC

## 2022-02-11 NOTE — Telephone Encounter (Signed)
Left VM for patient letting her know that prep instructions were on mychart, and can be picked up or mailed to her.

## 2022-02-17 ENCOUNTER — Other Ambulatory Visit: Payer: Self-pay | Admitting: Gastroenterology

## 2022-02-20 DIAGNOSIS — H52203 Unspecified astigmatism, bilateral: Secondary | ICD-10-CM | POA: Diagnosis not present

## 2022-02-20 DIAGNOSIS — H43813 Vitreous degeneration, bilateral: Secondary | ICD-10-CM | POA: Diagnosis not present

## 2022-02-20 DIAGNOSIS — H04123 Dry eye syndrome of bilateral lacrimal glands: Secondary | ICD-10-CM | POA: Diagnosis not present

## 2022-02-20 DIAGNOSIS — Z961 Presence of intraocular lens: Secondary | ICD-10-CM | POA: Diagnosis not present

## 2022-02-20 DIAGNOSIS — H524 Presbyopia: Secondary | ICD-10-CM | POA: Diagnosis not present

## 2022-03-02 NOTE — Telephone Encounter (Signed)
Confirmed patient has instructions for Procedure 03/11/22

## 2022-03-02 NOTE — Telephone Encounter (Signed)
Patient returning call. Please advise. Thank you.

## 2022-03-02 NOTE — Telephone Encounter (Signed)
Left VM for patient to return call letting me  know if they were able to see prep instructions on mychart.

## 2022-03-04 ENCOUNTER — Encounter: Payer: Self-pay | Admitting: Gastroenterology

## 2022-03-11 ENCOUNTER — Encounter: Payer: Medicare HMO | Admitting: Gastroenterology

## 2022-03-27 DIAGNOSIS — G309 Alzheimer's disease, unspecified: Secondary | ICD-10-CM | POA: Diagnosis not present

## 2022-03-27 DIAGNOSIS — F321 Major depressive disorder, single episode, moderate: Secondary | ICD-10-CM | POA: Diagnosis not present

## 2022-03-27 DIAGNOSIS — Z0000 Encounter for general adult medical examination without abnormal findings: Secondary | ICD-10-CM | POA: Diagnosis not present

## 2022-03-27 DIAGNOSIS — F0280 Dementia in other diseases classified elsewhere without behavioral disturbance: Secondary | ICD-10-CM | POA: Diagnosis not present

## 2022-04-01 ENCOUNTER — Encounter: Payer: Self-pay | Admitting: Gastroenterology

## 2022-04-01 ENCOUNTER — Ambulatory Visit (AMBULATORY_SURGERY_CENTER): Payer: Medicare HMO | Admitting: Gastroenterology

## 2022-04-01 ENCOUNTER — Other Ambulatory Visit: Payer: Self-pay | Admitting: Gastroenterology

## 2022-04-01 VITALS — BP 117/68 | HR 72 | Temp 97.6°F | Resp 19

## 2022-04-01 DIAGNOSIS — Z86010 Personal history of colonic polyps: Secondary | ICD-10-CM

## 2022-04-01 DIAGNOSIS — D509 Iron deficiency anemia, unspecified: Secondary | ICD-10-CM | POA: Diagnosis not present

## 2022-04-01 DIAGNOSIS — K2101 Gastro-esophageal reflux disease with esophagitis, with bleeding: Secondary | ICD-10-CM | POA: Diagnosis not present

## 2022-04-01 DIAGNOSIS — K090 Developmental odontogenic cysts: Secondary | ICD-10-CM | POA: Diagnosis not present

## 2022-04-01 DIAGNOSIS — K648 Other hemorrhoids: Secondary | ICD-10-CM

## 2022-04-01 DIAGNOSIS — Z79899 Other long term (current) drug therapy: Secondary | ICD-10-CM

## 2022-04-01 DIAGNOSIS — K2900 Acute gastritis without bleeding: Secondary | ICD-10-CM

## 2022-04-01 DIAGNOSIS — K219 Gastro-esophageal reflux disease without esophagitis: Secondary | ICD-10-CM

## 2022-04-01 DIAGNOSIS — K209 Esophagitis, unspecified without bleeding: Secondary | ICD-10-CM

## 2022-04-01 MED ORDER — SODIUM CHLORIDE 0.9 % IV SOLN: 500.0000 mL | Freq: Once | INTRAVENOUS | Status: DC

## 2022-04-01 MED ORDER — PANTOPRAZOLE SODIUM 20 MG PO TBEC: 20.0000 mg | DELAYED_RELEASE_TABLET | Freq: Two times a day (BID) | ORAL | 11 refills | Status: DC

## 2022-04-01 NOTE — Op Note (Signed)
[place] Patient Name: [name] Procedure Date: 04/01/2022 2:58 PM MRN: [id number] Endoscopist: Mauri Pole , MD, [id number] Age: 77 Referring MD:  Date of Birth: 1945-12-05 Gender: Female Account #: [id number] Procedure:                Upper GI endoscopy Indications:              Suspected upper gastrointestinal bleeding in                            patient with unexplained iron deficiency anemia Medicines:                Monitored Anesthesia Care Procedure:                Pre-Anesthesia Assessment:                           - Prior to the procedure, a History and Physical                            was performed, and patient medications and                            allergies were reviewed. The patient's tolerance of                            previous anesthesia was also reviewed. The risks                            and benefits of the procedure and the sedation                            options and risks were discussed with the patient.                            All questions were answered, and informed consent                            was obtained. Prior Anticoagulants: The patient has                            taken no anticoagulant or antiplatelet agents. ASA                            Grade Assessment: II - A patient with mild systemic                            disease. After reviewing the risks and benefits,                            the patient was deemed in satisfactory condition to                            undergo the procedure.  After obtaining informed consent, the endoscope was                            passed under direct vision. Throughout the                            procedure, the patient's blood pressure, pulse, and                            oxygen saturations were monitored continuously. The                            Endoscope Olympus [id number] was introduced through                            the  mouth, and advanced to the second part of                            duodenum. The patient tolerated the procedure well.                            The upper GI endoscopy was accomplished without                            difficulty. Scope In: Scope Out: Findings:                 LA Grade B (one or more mucosal breaks greater than                            5 mm, not extending between the tops of two mucosal                            folds) esophagitis with no bleeding was found 34 to                            36 cm from the incisors. Biopsies were taken with a                            cold forceps for histology.                           A large hiatal hernia was present.                           Patchy mild inflammation characterized by                            congestion (edema), erosions and erythema was found                            in the entire examined stomach. Biopsies were taken  with a cold forceps for Helicobacter pylori testing.                           The cardia and gastric fundus were normal on                            retroflexion.                           The examined duodenum was normal. Complications:            No immediate complications. Estimated Blood Loss:     Estimated blood loss was minimal. Impression:               - LA Grade B reflux esophagitis with no bleeding.                            Biopsied.                           - Large hiatal hernia.                           - Gastritis. Biopsied.                           - Normal examined duodenum. Recommendation:           - Patient has a contact number available for                            emergencies. The signs and symptoms of potential                            delayed complications were discussed with the                            patient. Return to normal activities tomorrow.                            Written discharge instructions were provided to the                             patient.                           - Resume previous diet.                           - Continue present medications.                           - Await pathology results.                           - Follow an antireflux regimen.                           - Use  Protonix (pantoprazole) 20 mg PO BID                            indefinitely. DC Omeprazole Mauri Pole, MD 04/01/2022 3:31:25 PM This report has been signed electronically.

## 2022-04-01 NOTE — Progress Notes (Unsigned)
[facility] History and Physical   Primary Care Physician:  Katherina Mires, MD   Reason for Procedure:  Iron deficiency anemia  Plan:    EGD and colonoscopy with possible interventions as needed     HPI: [name] is a very pleasant 77 y.o. female here for EGD and colonoscopy for iron deficiency anemia.   The risks and benefits as well as alternatives of endoscopic procedure(s) have been discussed and reviewed. All questions answered. The patient agrees to proceed.    Past Medical History:  Diagnosis Date   Alzheimer's dementia ([place])    Anxiety    Colon polyps    Dementia ([place])    Depression    GERD (gastroesophageal reflux disease)    Hiatal hernia    Internal hemorrhoids    Tinnitus     Past Surgical History:  Procedure Laterality Date   ABDOMINAL HYSTERECTOMY     APPENDECTOMY     CERVICAL SPINE SURGERY     knee surgery Right    MOUTH SURGERY     roof of mouth   TONSILLECTOMY     WISDOM TOOTH EXTRACTION      Prior to Admission medications   Medication Sig Start Date End Date Taking? Authorizing Provider  LORazepam (ATIVAN) 0.5 MG tablet TAKE 1 TABLET BY MOUTH ONCE DAILY AS NEEDED FOR ANXIETY 12/23/21  Yes Ward Givens, NP  Multiple Vitamin (MULTIVITAMIN) tablet Take 1 tablet by mouth daily.   Yes [provider]  omeprazole (PRILOSEC) 20 MG capsule Take 1 capsule (20 mg total) by mouth daily. 02/18/22  Yes Armbruster, Carlota Raspberry, MD  risperiDONE (RISPERDAL) 1 MG tablet Take 1 tablet (1 mg total) by mouth at bedtime. 01/21/22  Yes Ward Givens, NP  sertraline (ZOLOFT) 100 MG tablet Take 1.5 tablets (150 mg total) by mouth at bedtime. 01/21/22  Yes Ward Givens, NP  Ferrous Sulfate (IRON PO) Take 2 tablets by mouth daily.    [provider]    Current Outpatient Medications  Medication Sig Dispense Refill   LORazepam (ATIVAN) 0.5 MG tablet TAKE 1 TABLET BY MOUTH ONCE DAILY AS NEEDED FOR ANXIETY 30 tablet 2   Multiple  Vitamin (MULTIVITAMIN) tablet Take 1 tablet by mouth daily.     omeprazole (PRILOSEC) 20 MG capsule Take 1 capsule (20 mg total) by mouth daily. 90 capsule 1   risperiDONE (RISPERDAL) 1 MG tablet Take 1 tablet (1 mg total) by mouth at bedtime. 30 tablet 11   sertraline (ZOLOFT) 100 MG tablet Take 1.5 tablets (150 mg total) by mouth at bedtime. 45 tablet 5   Ferrous Sulfate (IRON PO) Take 2 tablets by mouth daily.     Current Facility-Administered Medications  Medication Dose Route Frequency Provider Last Rate Last Admin   0.9 %  sodium chloride infusion  500 mL Intravenous Once Mauri Pole, MD        Allergies as of 04/01/2022 - Review Complete 04/01/2022  Allergen Reaction Noted   Ibuprofen Nausea And Vomiting 01/05/2014    Family History  Problem Relation Age of Onset   Colon cancer Neg Hx    Breast cancer Neg Hx    Rectal cancer Neg Hx    Stomach cancer Neg Hx    Esophageal cancer Neg Hx    Alzheimer's disease Neg Hx    Dementia Neg Hx    Liver cancer Neg Hx     Social History   Socioeconomic History   Marital status: Married    Spouse  name: Collier Salina   Number of children: 2   Years of education: hs   Highest education level: Not on file  Occupational History   Occupation: Retired  Tobacco Use   Smoking status: Never   Smokeless tobacco: Never  Vaping Use   Vaping Use: Never used  Substance and Sexual Activity   Alcohol use: Yes    Alcohol/week: 0.0 standard drinks of alcohol    Comment: occ (glass of wine)   Drug use: No   Sexual activity: Yes    Birth control/protection: None  Other Topics Concern   Not on file  Social History Narrative   Right handed.   Caffeine 1/2 cup in the morning.  Married, 2 kids.  College.  Retired.     Social Determinants of Health   Financial Resource Strain: Not on file  Food Insecurity: Not on file  Transportation Needs: Not on file  Physical Activity: Not on file  Stress: Not on file  Social Connections: Not on file   Intimate Partner Violence: Not on file    Review of Systems:  All other review of systems negative except as mentioned in the HPI.  Physical Exam: Vital signs in last 24 hours: There were no vitals taken for this visit. General:   Alert, NAD Lungs:  Clear .   Heart:  Regular rate and rhythm Abdomen:  Soft, nontender and nondistended. Neuro/Psych:  Alert and cooperative. Normal mood and affect. A and O x 3  Reviewed labs, radiology imaging, old records and pertinent past GI work up  Patient is appropriate for planned procedure(s) and anesthesia in an ambulatory setting   K. Denzil Magnuson , MD [phone]

## 2022-04-01 NOTE — Op Note (Signed)
[facility] Patient Name: [name] Procedure Date: 04/01/2022 2:58 PM MRN: [id number] Endoscopist: Mauri Pole , MD, [id number] Age: 77 Referring MD:  Date of Birth: Jun 14, 1945 Gender: Female Account #: [id number] Procedure:                Colonoscopy Indications:              Unexplained iron deficiency anemia Medicines:                Monitored Anesthesia Care Procedure:                Pre-Anesthesia Assessment:                           - Prior to the procedure, a History and Physical                            was performed, and patient medications and                            allergies were reviewed. The patient's tolerance of                            previous anesthesia was also reviewed. The risks                            and benefits of the procedure and the sedation                            options and risks were discussed with the patient.                            All questions were answered, and informed consent                            was obtained. Prior Anticoagulants: The patient has                            taken no anticoagulant or antiplatelet agents. ASA                            Grade Assessment: II - A patient with mild systemic                            disease. After reviewing the risks and benefits,                            the patient was deemed in satisfactory condition to                            undergo the procedure.                           After obtaining informed consent, the colonoscope  was passed under direct vision. Throughout the                            procedure, the patient's blood pressure, pulse, and                            oxygen saturations were monitored continuously. The                            PCF-HQ190L Colonoscope was introduced through the                            anus with the intention of advancing to the cecum.                            The scope was  advanced to the rectum before the                            procedure was aborted. Medications were given. The                            colonoscopy was performed with difficulty due to                            poor bowel prep with stool present. The patient                            tolerated the procedure well. Scope In: 3:10:43 PM Scope Out: 3:11:05 PM Total Procedure Duration: 0 hours 0 minutes 22 seconds  Findings:                 The perianal and digital rectal examinations were                            normal.                           Stool was found in the rectum, making visualization                            difficult. Complications:            No immediate complications. Estimated Blood Loss:     Estimated blood loss: none. Impression:               - Stool in the rectum.                           - No specimens collected. Recommendation:           - Resume previous diet.                           - Continue present medications.                           - Repeat colonoscopy at the next available  appointment because the bowel preparation was                            suboptimal. Mauri Pole, MD 04/01/2022 3:33:00 PM This report has been signed electronically.

## 2022-04-01 NOTE — Progress Notes (Unsigned)
Report to pacu rn. Vss. Care resumed by rn. 

## 2022-04-01 NOTE — Progress Notes (Unsigned)
Pt's states no medical or surgical changes since previsit or office visit. VS assessed by C.W 

## 2022-04-01 NOTE — Patient Instructions (Addendum)
Handout on esophagitis and gastritis given to patient. Await pathology results. Pick up prescription medication Protonix (pantoprazole) 20 mg twice a day indefinitely. Discontinue the omeprazole. - Pick up from [facility] in [facility] off of [address]. Repeat colonoscopy for surveillance - Dr. Silverio Decamp will call and discuss with patient and family results of biopsies taken and further recommendations     YOU HAD AN ENDOSCOPIC PROCEDURE TODAY AT [place]:   Refer to the procedure report that was given to you for any specific questions about what was found during the examination.  If the procedure report does not answer your questions, please call your gastroenterologist to clarify.  If you requested that your care partner not be given the details of your procedure findings, then the procedure report has been included in a sealed envelope for you to review at your convenience later.  YOU SHOULD EXPECT: Some feelings of bloating in the abdomen. Passage of more gas than usual.  Walking can help get rid of the air that was put into your GI tract during the procedure and reduce the bloating. If you had a lower endoscopy (such as a colonoscopy or flexible sigmoidoscopy) you may notice spotting of blood in your stool or on the toilet paper. If you underwent a bowel prep for your procedure, you may not have a normal bowel movement for a few days.  Please Note:  You might notice some irritation and congestion in your nose or some drainage.  This is from the oxygen used during your procedure.  There is no need for concern and it should clear up in a day or so.  SYMPTOMS TO REPORT IMMEDIATELY:  Following lower endoscopy (colonoscopy or flexible sigmoidoscopy):  Excessive amounts of blood in the stool  Significant tenderness or worsening of abdominal pains  Swelling of the abdomen that is new, acute  Fever of 100F or higher  Following upper endoscopy (EGD)  Vomiting of blood or  coffee ground material  New chest pain or pain under the shoulder blades  Painful or persistently difficult swallowing  New shortness of breath  Fever of 100F or higher  Black, tarry-looking stools  For urgent or emergent issues, a gastroenterologist can be reached at any hour by calling [phone]. Do not use MyChart messaging for urgent concerns.    DIET:  We do recommend a small meal at first, but then you may proceed to your regular diet.  Drink plenty of fluids but you should avoid alcoholic beverages for 24 hours.  ACTIVITY:  You should plan to take it easy for the rest of today and you should NOT DRIVE or use heavy machinery until tomorrow (because of the sedation medicines used during the test).    FOLLOW UP: Our staff will call the number listed on your records the next business day following your procedure.  We will call around 7:15- 8:00 am to check on you and address any questions or concerns that you may have regarding the information given to you following your procedure. If we do not reach you, we will leave a message.     If any biopsies were taken you will be contacted by phone or by letter within the next 1-3 weeks.  Please call us at [phone] if you have not heard about the biopsies in 3 weeks.    SIGNATURES/CONFIDENTIALITY: You and/or your care partner have signed paperwork which will be entered into your electronic medical record.  These signatures attest to the  fact that that the information above on your After Visit Summary has been reviewed and is understood.  Full responsibility of the confidentiality of this discharge information lies with you and/or your care-partner.

## 2022-04-01 NOTE — Progress Notes (Signed)
Called to room to assist during endoscopic procedure.  Patient ID and intended procedure confirmed with present staff. Received instructions for my participation in the procedure from the performing physician.  

## 2022-04-02 ENCOUNTER — Encounter: Payer: Self-pay | Admitting: Gastroenterology

## 2022-04-02 NOTE — Telephone Encounter (Signed)
Attempted f/u call. No answer, left VM. 

## 2022-04-08 ENCOUNTER — Telehealth: Payer: Self-pay | Admitting: Gastroenterology

## 2022-04-08 MED ORDER — PANTOPRAZOLE SODIUM 20 MG PO TBEC: 40.0000 mg | DELAYED_RELEASE_TABLET | Freq: Every day | ORAL | 3 refills | Status: DC

## 2022-04-08 NOTE — Telephone Encounter (Signed)
Dr Silverio Decamp I called the pharmacy and they said insurance will not cover the Protonix 20 mg for twice a day. But they will pay for the 40 mg once a day. What do you want to do?  Thanks

## 2022-04-08 NOTE — Telephone Encounter (Signed)
I sent in pantoprazole 20 mg to take 2 capsules once a day to see if this is covered. I will explain to pt if covered to take 1 capsule twice  day

## 2022-04-08 NOTE — Telephone Encounter (Signed)
Patient spouse calling in regards to Protonix. States pharmacy says something is wrong with the prescription. Patient spouse in unaware what the problem is. Please advise.

## 2022-04-08 NOTE — Telephone Encounter (Signed)
Can you please send Rx for 40 mg daily, choose 20 mg 2 capsules instead for 40 mg capsule and advise patient to take 20 mg in the morning and 20 mg evening.

## 2022-04-09 ENCOUNTER — Ambulatory Visit: Payer: Medicare HMO | Admitting: Adult Health

## 2022-04-09 ENCOUNTER — Encounter: Payer: Self-pay | Admitting: Adult Health

## 2022-04-09 ENCOUNTER — Encounter: Payer: Self-pay | Admitting: Gastroenterology

## 2022-04-09 VITALS — BP 122/63 | HR 79 | Ht 64.0 in | Wt 157.0 lb

## 2022-04-09 DIAGNOSIS — G301 Alzheimer's disease with late onset: Secondary | ICD-10-CM | POA: Diagnosis not present

## 2022-04-09 DIAGNOSIS — F02818 Dementia in other diseases classified elsewhere, unspecified severity, with other behavioral disturbance: Secondary | ICD-10-CM | POA: Diagnosis not present

## 2022-04-09 NOTE — Patient Instructions (Signed)
Your Plan: ? ?Continue ? ? ? ? ?Thank you for coming to see us at [facility]. I hope we have been able to provide you high quality care today. ? ?You may receive a patient satisfaction survey over the next few weeks. We would appreciate your feedback and comments so that we may continue to improve ourselves and the health of our patients. ? ?

## 2022-04-09 NOTE — Progress Notes (Signed)
PATIENT: [name] DOB: 02-01-1946  REASON FOR VISIT: follow up HISTORY FROM: patient  Chief Complaint  Patient presents with   Follow-up    Pt in 83 Pt here husband and caregiver Husband states no changes with patient since last visit       HISTORY OF PRESENT ILLNESS: Today 04/09/22:  [name] is a 77 y.o. female with a history of late onset Alzheimer's disease with behavioral disturbance. Returns today for follow-up.  She is here today with her caregiver her husband and her son.  They report that the change in her medication at the last visit have been beneficial.  Patient has some behavioral changes in the late afternoon but Ativan seems to help if it is severe.  They have a caregiver there in the evenings until the patient goes to bed.  She also goes to [facility] during the day.  The patient's son reports that his dad is looking at placing the patient in a facility.  However the son nor the caregiver feel that this is in her best interest.  They would like to continue having caregivers come to the home to assist the patient if it is needed.     01/21/2022: [name] is a 77 year old female with a history of Alzheimer's dementia with behavioral disturbance.  She returns today for follow-up.  She is here today with her caregiver and her husband.  Her son is on the telephone listening to the visit.  Caregiver explains that in the afternoons when she comes home at 5 PM from [facility] she can sometimes be fidgety often want to go outside and sleep leaves or move objects in the house.  She states that this is very disturbing to her husband.  She also acknowledges that the patient does have episodes where she is more agitated sometimes can be combative.  Right now her husband is dosing her medication.  He gives her all of her medication when she comes home from [facility].  Is given her 2 mg of Risperdal, 200 mg of Zoloft and sometimes given her the Ativan.  The patient was taking  Risperdal 1 mg twice a day but could not tolerate the morning dose as it was making her too sleepy while she was at [facility].  The husband is looking to get a caregiver to stay past 6 PM to help with bedtime routine.  May  09/18/21:[name] is a 77 year old female with a history of Alzheimer's disease with behavioral disturbance.  She returns today for follow-up. Here today with her husband and Son. Behavior is better. No melt downs in the afternoon like usual. Still some behavioral issues. Not having to use ativan in the last month. Son is now managing medication. They have an aide that comes in to assist with ADLs. Goes to [facility] daily- all day. Some trouble getting her to get in the bed. No trouble falling asleep once in bed. Son thinking about trying melatonin   03/31/21 [name] is a 77 year old female with a history of Alzheimer's disease with behavioral disturbance.  She returns today for follow-up.  She is here today with her husband and son.  Her husband reports that she is still having behavioral disturbances and delusions.  She is convinced that her aide is having an affair with her husband.  Her husband reports that sometimes she is hard to manage.  He has been giving her Ativan up to twice a day even the prescription is written for once a day.  He also states that prior to this appointment he was having trouble getting her to come and he gave her Ativan and another dose of Seroquel.  He states that there are some days that he gives her up to 4 doses of Seroquel.  Is currently prescribed 50 mg twice a day.  Son states that she tends to be most agitated with him.  She does go to a day center twice a week and does well there.  When her aide is there she does well with her.  Most of her agitation is directed at her husband.  [name] is a 77 year old female with a history of Alzheimer's disease with behavioral disturbance.  She returns today for follow-up.  Her husband is with her.  He  feels that her symptoms have worsened.  He has an aide that comes in 3 days a week and reports that the patient gets very agitated with her.  States that the patient accuses him of having an affair with her.  He has been dosing her Seroquel giving her 50 mg 3 times a day.  Per our records we prescribed 50 mg at bedtime.  He also reports that he had an old prescription of Ativan for the patient and has been giving her that when she has uncontrollable agitation.  He states that it typically works well.  She is continued on the Exelon patch however he is considering discontinuing this medication.  They typically travel to [place] for the winter but he has not made a decision if he is going to go.   HISTORY 09/18/20:  [name] is a 77 year old female with a history of Alzheimer's disease with behavioral disturbance.  She returns today for follow-up.  She is here today with her husband and son.  Husband reports that she is getting harder to care for due to more confusion and behavioral issues.  This primarily occurs in the evening hours.  For example the patient sometimes wants to cook and go buy food.  The husband refuses as he knows that she will not be able to do it.  This sometimes causes the patient to become aggravated.  The patient is currently on Seroquel 25 mg at bedtime.  She remains on Exelon patch 9.5 mg.  She is able to complete all ADLs independently.  She does not operate a motor vehicle.  Her husband helps manage her medications and appointments.  REVIEW OF SYSTEMS: Out of a complete 14 system review of symptoms, the patient complains only of the following symptoms, and all other reviewed systems are negative.  See HPI  ALLERGIES: Allergies  Allergen Reactions   Ibuprofen Nausea And Vomiting    HOME MEDICATIONS: Outpatient Medications Prior to Visit  Medication Sig Dispense Refill   Ferrous Sulfate (IRON PO) Take 2 tablets by mouth daily.     LORazepam (ATIVAN) 0.5 MG tablet TAKE 1  TABLET BY MOUTH ONCE DAILY AS NEEDED FOR ANXIETY 30 tablet 2   Multiple Vitamin (MULTIVITAMIN) tablet Take 1 tablet by mouth daily.     risperiDONE (RISPERDAL) 1 MG tablet Take 1 tablet (1 mg total) by mouth at bedtime. 30 tablet 11   sertraline (ZOLOFT) 100 MG tablet Take 1.5 tablets (150 mg total) by mouth at bedtime. 45 tablet 5   pantoprazole (PROTONIX) 20 MG tablet Take 2 tablets (40 mg total) by mouth daily. (Patient not taking: Reported on 04/09/2022) 60 tablet 3   No facility-administered medications prior to visit.    PAST MEDICAL  HISTORY: Past Medical History:  Diagnosis Date   Alzheimer's dementia ([place])    Anxiety    Colon polyps    Dementia ([facility])    Depression    GERD (gastroesophageal reflux disease)    Hiatal hernia    Internal hemorrhoids    Tinnitus     PAST SURGICAL HISTORY: Past Surgical History:  Procedure Laterality Date   ABDOMINAL HYSTERECTOMY     APPENDECTOMY     CERVICAL SPINE SURGERY     knee surgery Right    MOUTH SURGERY     roof of mouth   TONSILLECTOMY     WISDOM TOOTH EXTRACTION      FAMILY HISTORY: Family History  Problem Relation Age of Onset   Colon cancer Neg Hx    Breast cancer Neg Hx    Rectal cancer Neg Hx    Stomach cancer Neg Hx    Esophageal cancer Neg Hx    Alzheimer's disease Neg Hx    Dementia Neg Hx    Liver cancer Neg Hx     SOCIAL HISTORY: Social History   Socioeconomic History   Marital status: Married    Spouse name: Collier Salina   Number of children: 2   Years of education: hs   Highest education level: Not on file  Occupational History   Occupation: Retired  Tobacco Use   Smoking status: Never   Smokeless tobacco: Never  Vaping Use   Vaping Use: Never used  Substance and Sexual Activity   Alcohol use: Yes    Alcohol/week: 0.0 standard drinks of alcohol    Comment: occ (glass of wine)   Drug use: No   Sexual activity: Yes    Birth control/protection: None  Other Topics Concern   Not on file  Social  History Narrative   Right handed.   Caffeine 1/2 cup in the morning.  Married, 2 kids.  College.  Retired.     Social Determinants of Health   Financial Resource Strain: Not on file  Food Insecurity: Not on file  Transportation Needs: Not on file  Physical Activity: Not on file  Stress: Not on file  Social Connections: Not on file  Intimate Partner Violence: Not on file      PHYSICAL EXAM  Vitals:   04/09/22 1017  BP: 122/63  Pulse: 79  Weight: 157 lb (71.2 kg)  Height: '5\' 4"'$  (1.626 m)    Body mass index is 26.95 kg/m.    02/21/2021   10:43 AM 09/18/2020    2:13 PM 02/12/2020    1:31 PM  MMSE - Mini Mental State Exam  Orientation to time 1 0 3  Orientation to Place '1 5 5  '$ Registration '3 3 3  '$ Attention/ Calculation 0 0 1  Recall 0 0 0  Language- name 2 objects '2 2 2  '$ Language- repeat 0 1 1  Language- follow 3 step command '2 3 3  '$ Language- read & follow direction 0 1 0  Write a sentence 1 0 1  Copy design 0 0 0  Total score '10 15 19     '$ Generalized: Well developed, in no acute distress   Neurological examination  Mentation: Alert.  Follows all commands speech and language fluent Cranial nerve II-XII: Pupils were equal round reactive to light. Extraocular movements were full, visual field were full on confrontational test. Facial sensation and strength were normal.  Head turning and shoulder shrug  were normal and symmetric. Gait and station: Gait is normal.  DIAGNOSTIC DATA (LABS, IMAGING, TESTING) - I reviewed patient records, labs, notes, testing and imaging myself where available.  Lab Results  Component Value Date   WBC 4.3 02/05/2022   HGB 11.3 (L) 02/05/2022   HCT 36.2 02/05/2022   MCV 71.6 (L) 02/05/2022   PLT 340.0 02/05/2022      Component Value Date/Time   NA 137 01/05/2014 1303   K 5.1 01/05/2014 1303   CL 100 01/05/2014 1303   CO2 28 01/05/2014 1303   GLUCOSE 95 01/05/2014 1303   GLUCOSE 80 09/17/2006 1233   BUN 14 01/05/2014 1303    CREATININE 0.79 01/05/2014 1303   CALCIUM 10.3 01/05/2014 1303   PROT 7.3 01/05/2014 1303   ALBUMIN 4.7 01/05/2014 1303   AST 22 01/05/2014 1303   ALT 24 01/05/2014 1303   ALKPHOS 74 01/05/2014 1303   BILITOT 0.2 01/05/2014 1303   GFRNONAA 78 01/05/2014 1303   GFRAA 90 01/05/2014 1303      ASSESSMENT AND PLAN 77 y.o. year old female  has a past medical history of Alzheimer's dementia ([place]), Anxiety, Colon polyps, Dementia ([place]), Depression, GERD (gastroesophageal reflux disease), Hiatal hernia, Internal hemorrhoids, and Tinnitus. here with:  Alzheimer's Disease with behavior disturbance  -Continue Risperdal 1 mg at 5:30 -Continue zoloft to 150 mg at bedtime  - Continue Ativan 0.5 mg daily PRN -Advised that if they can keep the patient in her own environment for as long as possible this will decrease possible confusion. - FU 6 months or sooner if needed      Ward Givens, MSN, NP-C 04/09/2022, 10:35 AM [facility] [address] [place], [place] [address] [phone]

## 2022-05-04 ENCOUNTER — Other Ambulatory Visit: Payer: Self-pay | Admitting: Adult Health

## 2022-05-04 DIAGNOSIS — F02818 Dementia in other diseases classified elsewhere, unspecified severity, with other behavioral disturbance: Secondary | ICD-10-CM

## 2022-05-11 ENCOUNTER — Other Ambulatory Visit: Payer: Self-pay | Admitting: Adult Health

## 2022-05-11 ENCOUNTER — Telehealth: Payer: Self-pay | Admitting: Adult Health

## 2022-05-11 DIAGNOSIS — G301 Alzheimer's disease with late onset: Secondary | ICD-10-CM

## 2022-05-11 NOTE — Telephone Encounter (Signed)
I called after I faxed to [facility] and pharmacy states that could take 30 min or so for this to be authenticated).  They had not received).  Will check back later.

## 2022-05-11 NOTE — Telephone Encounter (Signed)
I have refaxed to them at [facility] the prescription (did not receive the 05/06/22 fax).

## 2022-05-11 NOTE — Telephone Encounter (Signed)
I called [facility], spoke to [place] in pharmacy.  Last fill date 03-31-2022 #30.  No new prescription.  Needs new prescription.  MM/NP out today.  Sent to Dr. Brett Fairy.  (No printed [facility] from [facility]

## 2022-05-11 NOTE — Telephone Encounter (Signed)
I called and relayed that [facility] did not get 05-06-2022 prescription , refaxed and they did receive.  Should be able to get the medication now.   Call back if needed.

## 2022-05-11 NOTE — Telephone Encounter (Signed)
Pt husband is calling. [facility]'t [facility]ill for LORazepam 0.5 MG. He is requesting a call back from nurse on when they will receive prescription.

## 2022-05-11 NOTE — Telephone Encounter (Signed)
I called and prescription did go thru ([facility]).

## 2022-05-14 NOTE — Telephone Encounter (Signed)
Has already been addressed 

## 2022-05-18 ENCOUNTER — Ambulatory Visit: Payer: Medicare HMO | Admitting: Adult Health

## 2022-05-20 DIAGNOSIS — Z1330 Encounter for screening examination for mental health and behavioral disorders, unspecified: Secondary | ICD-10-CM | POA: Diagnosis not present

## 2022-05-20 DIAGNOSIS — R2681 Unsteadiness on feet: Secondary | ICD-10-CM | POA: Diagnosis not present

## 2022-05-20 DIAGNOSIS — R059 Cough, unspecified: Secondary | ICD-10-CM | POA: Diagnosis not present

## 2022-05-29 DIAGNOSIS — R52 Pain, unspecified: Secondary | ICD-10-CM | POA: Diagnosis not present

## 2022-05-29 DIAGNOSIS — M170 Bilateral primary osteoarthritis of knee: Secondary | ICD-10-CM | POA: Diagnosis not present

## 2022-06-01 ENCOUNTER — Other Ambulatory Visit: Payer: Self-pay | Admitting: Gastroenterology

## 2022-06-11 DIAGNOSIS — M25562 Pain in left knee: Secondary | ICD-10-CM | POA: Diagnosis not present

## 2022-06-11 DIAGNOSIS — M25561 Pain in right knee: Secondary | ICD-10-CM | POA: Diagnosis not present

## 2022-06-12 DIAGNOSIS — G309 Alzheimer's disease, unspecified: Secondary | ICD-10-CM | POA: Diagnosis not present

## 2022-06-12 DIAGNOSIS — K1379 Other lesions of oral mucosa: Secondary | ICD-10-CM | POA: Diagnosis not present

## 2022-06-12 DIAGNOSIS — F02818 Dementia in other diseases classified elsewhere, unspecified severity, with other behavioral disturbance: Secondary | ICD-10-CM | POA: Diagnosis not present

## 2022-06-12 DIAGNOSIS — G300 Alzheimer's disease with early onset: Secondary | ICD-10-CM | POA: Diagnosis not present

## 2022-06-12 DIAGNOSIS — F0280 Dementia in other diseases classified elsewhere without behavioral disturbance: Secondary | ICD-10-CM | POA: Diagnosis not present

## 2022-06-18 DIAGNOSIS — M25561 Pain in right knee: Secondary | ICD-10-CM | POA: Diagnosis not present

## 2022-06-18 DIAGNOSIS — M25562 Pain in left knee: Secondary | ICD-10-CM | POA: Diagnosis not present

## 2022-06-25 DIAGNOSIS — M25562 Pain in left knee: Secondary | ICD-10-CM | POA: Diagnosis not present

## 2022-06-25 DIAGNOSIS — M25561 Pain in right knee: Secondary | ICD-10-CM | POA: Diagnosis not present

## 2022-06-30 DIAGNOSIS — G309 Alzheimer's disease, unspecified: Secondary | ICD-10-CM | POA: Diagnosis not present

## 2022-06-30 DIAGNOSIS — F0280 Dementia in other diseases classified elsewhere without behavioral disturbance: Secondary | ICD-10-CM | POA: Diagnosis not present

## 2022-06-30 DIAGNOSIS — D649 Anemia, unspecified: Secondary | ICD-10-CM | POA: Diagnosis not present

## 2022-06-30 DIAGNOSIS — R7303 Prediabetes: Secondary | ICD-10-CM | POA: Diagnosis not present

## 2022-07-02 ENCOUNTER — Telehealth: Payer: Self-pay | Admitting: Adult Health

## 2022-07-02 NOTE — Telephone Encounter (Signed)
Husband states pt is becoming paranoid, she is screaming, cursing, throwing things and becoming difficult to handle.  Husband is asking if there can be a change or increase in medication.

## 2022-07-02 NOTE — Telephone Encounter (Signed)
LVM for Husband (checked DPR) to call back to discuss message left

## 2022-07-02 NOTE — Telephone Encounter (Signed)
Spoke to husband (checked DPR ) informed husband  per Megan,NP to take patient to PCP or urgent care ASAP to make sure patient doesn't have infection. Husband states patient is currently at adult daycare . Husband states not able to take patient today to PCP or urgent care Informed husband very important to take patient to PCP or urgent care  to rule out  infection. Informed husband to call PCP today to make them aware of behavioral changes in patient Husband expressed understanding and thanked me for calling

## 2022-07-03 DIAGNOSIS — F0280 Dementia in other diseases classified elsewhere without behavioral disturbance: Secondary | ICD-10-CM | POA: Diagnosis not present

## 2022-07-03 DIAGNOSIS — G309 Alzheimer's disease, unspecified: Secondary | ICD-10-CM | POA: Diagnosis not present

## 2022-07-09 ENCOUNTER — Telehealth: Payer: Self-pay | Admitting: Adult Health

## 2022-07-10 ENCOUNTER — Other Ambulatory Visit: Payer: Self-pay | Admitting: Gastroenterology

## 2022-07-14 DIAGNOSIS — M25562 Pain in left knee: Secondary | ICD-10-CM | POA: Diagnosis not present

## 2022-07-14 DIAGNOSIS — M25561 Pain in right knee: Secondary | ICD-10-CM | POA: Diagnosis not present

## 2022-07-17 DIAGNOSIS — M25562 Pain in left knee: Secondary | ICD-10-CM | POA: Diagnosis not present

## 2022-07-17 DIAGNOSIS — M25561 Pain in right knee: Secondary | ICD-10-CM | POA: Diagnosis not present

## 2022-07-20 DIAGNOSIS — M25562 Pain in left knee: Secondary | ICD-10-CM | POA: Diagnosis not present

## 2022-07-20 DIAGNOSIS — M25561 Pain in right knee: Secondary | ICD-10-CM | POA: Diagnosis not present

## 2022-07-22 DIAGNOSIS — M25561 Pain in right knee: Secondary | ICD-10-CM | POA: Diagnosis not present

## 2022-07-22 DIAGNOSIS — M25562 Pain in left knee: Secondary | ICD-10-CM | POA: Diagnosis not present

## 2022-07-30 ENCOUNTER — Other Ambulatory Visit: Payer: Self-pay | Admitting: Gastroenterology

## 2022-07-30 DIAGNOSIS — M25562 Pain in left knee: Secondary | ICD-10-CM | POA: Diagnosis not present

## 2022-07-30 DIAGNOSIS — M25561 Pain in right knee: Secondary | ICD-10-CM | POA: Diagnosis not present

## 2022-08-04 DIAGNOSIS — R21 Rash and other nonspecific skin eruption: Secondary | ICD-10-CM | POA: Diagnosis not present

## 2022-08-05 DIAGNOSIS — R21 Rash and other nonspecific skin eruption: Secondary | ICD-10-CM | POA: Diagnosis not present

## 2022-08-06 DIAGNOSIS — R21 Rash and other nonspecific skin eruption: Secondary | ICD-10-CM | POA: Diagnosis not present

## 2022-08-06 DIAGNOSIS — L308 Other specified dermatitis: Secondary | ICD-10-CM | POA: Diagnosis not present

## 2022-08-13 DIAGNOSIS — M25562 Pain in left knee: Secondary | ICD-10-CM | POA: Diagnosis not present

## 2022-08-13 DIAGNOSIS — M25561 Pain in right knee: Secondary | ICD-10-CM | POA: Diagnosis not present

## 2022-09-02 DIAGNOSIS — M25561 Pain in right knee: Secondary | ICD-10-CM | POA: Diagnosis not present

## 2022-09-02 DIAGNOSIS — R4689 Other symptoms and signs involving appearance and behavior: Secondary | ICD-10-CM | POA: Diagnosis not present

## 2022-09-02 DIAGNOSIS — R350 Frequency of micturition: Secondary | ICD-10-CM | POA: Diagnosis not present

## 2022-09-02 DIAGNOSIS — M25562 Pain in left knee: Secondary | ICD-10-CM | POA: Diagnosis not present

## 2022-09-02 DIAGNOSIS — G8929 Other chronic pain: Secondary | ICD-10-CM | POA: Diagnosis not present

## 2022-09-08 DIAGNOSIS — M25562 Pain in left knee: Secondary | ICD-10-CM | POA: Diagnosis not present

## 2022-09-08 DIAGNOSIS — M25561 Pain in right knee: Secondary | ICD-10-CM | POA: Diagnosis not present

## 2022-09-09 ENCOUNTER — Encounter: Payer: Self-pay | Admitting: Adult Health

## 2022-09-15 DIAGNOSIS — M25561 Pain in right knee: Secondary | ICD-10-CM | POA: Diagnosis not present

## 2022-09-15 DIAGNOSIS — M25562 Pain in left knee: Secondary | ICD-10-CM | POA: Diagnosis not present

## 2022-09-22 NOTE — Telephone Encounter (Signed)
I spoke with the pt's son, Minerva Areola. He had reviewed my message. He asked if it was better suited to lower the dosage. He feels the patient does better overall when she takes it throughout the day. Patient is at the adult daycare Monday-Friday during the day. Her husband administers morning and evening meds. He reports the patient does better with a 3 pm dose of Risperidone. [facility] will not administer any medication from a pill pack or if the bottle doesn't have the exact instructions on it. The pt's son is asking if we are able to write a separate prescription that says to take 1 tablet at 3 pm. He is going to [facility] and then get back to [place] through [place] or a call. He is getting ready to go out of town. He said if this is handled next week, that will be fine.

## 2022-09-27 ENCOUNTER — Other Ambulatory Visit: Payer: Self-pay | Admitting: Adult Health

## 2022-09-28 ENCOUNTER — Other Ambulatory Visit: Payer: Self-pay | Admitting: Adult Health

## 2022-10-19 ENCOUNTER — Other Ambulatory Visit: Payer: Self-pay | Admitting: Gastroenterology

## 2022-10-20 ENCOUNTER — Ambulatory Visit: Payer: Medicare HMO | Admitting: Adult Health

## 2022-10-20 ENCOUNTER — Encounter: Payer: Self-pay | Admitting: Adult Health

## 2022-10-20 VITALS — BP 98/62 | HR 76 | Ht 65.0 in | Wt 161.0 lb

## 2022-10-20 DIAGNOSIS — F02818 Dementia in other diseases classified elsewhere, unspecified severity, with other behavioral disturbance: Secondary | ICD-10-CM | POA: Diagnosis not present

## 2022-10-20 DIAGNOSIS — G301 Alzheimer's disease with late onset: Secondary | ICD-10-CM

## 2022-10-20 MED ORDER — RISPERIDONE 1 MG PO TABS: 1.0000 mg | ORAL_TABLET | Freq: Two times a day (BID) | ORAL | 11 refills | Status: DC

## 2022-10-20 NOTE — Progress Notes (Signed)
PATIENT: [name] DOB: [birth year]  REASON FOR VISIT: follow up HISTORY FROM: patient  Chief Complaint  Patient presents with   RM 9    Patient is here with her husband. Patient states she is feeling fine. She goes to [facility]. Pt's husband states she is getting worse. She is taking Risperdal 1 mg three times a day now.       HISTORY OF PRESENT ILLNESS: Today 10/20/22:  [name] is a 77 y.o. female with a history of Alzheimer's disease with behavioral disturbance. Returns today for follow-up. Overall things are stable. Son states that he stayed with her for a week and she only took risperadal 1 mg BID and did well. Son states that he only had to use ativan once that week. Continues on zoloft at bedtime.  Continues to go to [facility] during the day.  Has a caregiver there in the morning in the evenings.  04/09/22: [name] is a 77 y.o. female with a history of late onset Alzheimer's disease with behavioral disturbance. Returns today for follow-up.  She is here today with her caregiver her husband and her son.  They report that the change in her medication at the last visit have been beneficial.  Patient has some behavioral changes in the late afternoon but Ativan seems to help if it is severe.  They have a caregiver there in the evenings until the patient goes to bed.  She also goes to [facility] during the day.  The patient's son reports that his dad is looking at placing the patient in a facility.  However the son nor the caregiver feel that this is in her best interest.  They would like to continue having caregivers come to the home to assist the patient if it is needed.     01/21/2022: [name] is a 77 year old female with a history of Alzheimer's dementia with behavioral disturbance.  She returns today for follow-up.  She is here today with her caregiver and her husband.  Her son is on the telephone listening to the visit.  Caregiver explains  that in the afternoons when she comes home at 5 PM from [facility] she can sometimes be fidgety often want to go outside and sleep leaves or move objects in the house.  She states that this is very disturbing to her husband.  She also acknowledges that the patient does have episodes where she is more agitated sometimes can be combative.  Right now her husband is dosing her medication.  He gives her all of her medication when she comes home from [facility].  Is given her 2 mg of Risperdal, 200 mg of Zoloft and sometimes given her the Ativan.  The patient was taking Risperdal 1 mg twice a day but could not tolerate the morning dose as it was making her too sleepy while she was at [facility].  The husband is looking to get a caregiver to stay past 6 PM to help with bedtime routine.  May  09/18/21:[name] is a 77 year old female with a history of Alzheimer's disease with behavioral disturbance.  She returns today for follow-up. Here today with her husband and Son. Behavior is better. No melt downs in the afternoon like usual. Still some behavioral issues. Not having to use ativan in the last month. Son is now managing medication. They have an aide that comes in to assist with ADLs. Goes to [facility] daily- all day. Some trouble getting her to get in the bed.  No trouble falling asleep once in bed. Son thinking about trying melatonin   03/31/21 [name] is a 77 year old female with a history of Alzheimer's disease with behavioral disturbance.  She returns today for follow-up.  She is here today with her husband and son.  Her husband reports that she is still having behavioral disturbances and delusions.  She is convinced that her aide is having an affair with her husband.  Her husband reports that sometimes she is hard to manage.  He has been giving her Ativan up to twice a day even the prescription is written for once a day.  He also states that prior to this appointment he was having trouble getting her to  come and he gave her Ativan and another dose of Seroquel.  He states that there are some days that he gives her up to 4 doses of Seroquel.  Is currently prescribed 50 mg twice a day.  Son states that she tends to be most agitated with him.  She does go to a day center twice a week and does well there.  When her aide is there she does well with her.  Most of her agitation is directed at her husband.  [name] is a 77 year old female with a history of Alzheimer's disease with behavioral disturbance.  She returns today for follow-up.  Her husband is with her.  He feels that her symptoms have worsened.  He has an aide that comes in 3 days a week and reports that the patient gets very agitated with her.  States that the patient accuses him of having an affair with her.  He has been dosing her Seroquel giving her 50 mg 3 times a day.  Per our records we prescribed 50 mg at bedtime.  He also reports that he had an old prescription of Ativan for the patient and has been giving her that when she has uncontrollable agitation.  He states that it typically works well.  She is continued on the Exelon patch however he is considering discontinuing this medication.  They typically travel to [place] for the winter but he has not made a decision if he is going to go.   HISTORY 09/18/20:  [name] is a 77 year old female with a history of Alzheimer's disease with behavioral disturbance.  She returns today for follow-up.  She is here today with her husband and son.  Husband reports that she is getting harder to care for due to more confusion and behavioral issues.  This primarily occurs in the evening hours.  For example the patient sometimes wants to cook and go buy food.  The husband refuses as he knows that she will not be able to do it.  This sometimes causes the patient to become aggravated.  The patient is currently on Seroquel 25 mg at bedtime.  She remains on Exelon patch 9.5 mg.  She is able to complete all ADLs  independently.  She does not operate a motor vehicle.  Her husband helps manage her medications and appointments.  REVIEW OF SYSTEMS: Out of a complete 14 system review of symptoms, the patient complains only of the following symptoms, and all other reviewed systems are negative.  See HPI  ALLERGIES: Allergies  Allergen Reactions   Ibuprofen Nausea And Vomiting    HOME MEDICATIONS: Outpatient Medications Prior to Visit  Medication Sig Dispense Refill   Ferrous Sulfate (IRON PO) Take 2 tablets by mouth daily.     LORazepam (ATIVAN) 0.5 MG tablet  TAKE 1 TABLET BY MOUTH EVERY DAY AS NEEDED FOR ANXIETY 30 tablet 5   Multiple Vitamin (MULTIVITAMIN) tablet Take 1 tablet by mouth daily.     pantoprazole (PROTONIX) 20 MG tablet Take 2 tablets (40 mg total) by mouth daily. (Patient not taking: Reported on 04/09/2022) 60 tablet 3   risperiDONE (RISPERDAL) 1 MG tablet TAKE 1 TABLET BY MOUTH TWICE A DAY 60 tablet 11   sertraline (ZOLOFT) 100 MG tablet TAKE 1.5 TABLETS BY MOUTH AT BEDTIME. 45 tablet 5   No facility-administered medications prior to visit.    PAST MEDICAL HISTORY: Past Medical History:  Diagnosis Date   Alzheimer's dementia (HCC)    Anxiety    Colon polyps    Dementia (HCC)    Depression    GERD (gastroesophageal reflux disease)    Hiatal hernia    Internal hemorrhoids    Tinnitus     PAST SURGICAL HISTORY: Past Surgical History:  Procedure Laterality Date   ABDOMINAL HYSTERECTOMY     APPENDECTOMY     CERVICAL SPINE SURGERY     knee surgery Right    MOUTH SURGERY     roof of mouth   TONSILLECTOMY     WISDOM TOOTH EXTRACTION      FAMILY HISTORY: Family History  Problem Relation Age of Onset   Colon cancer Neg Hx    Breast cancer Neg Hx    Rectal cancer Neg Hx    Stomach cancer Neg Hx    Esophageal cancer Neg Hx    Alzheimer's disease Neg Hx    Dementia Neg Hx    Liver cancer Neg Hx     SOCIAL HISTORY: Social History   Socioeconomic History    Marital status: Married    Spouse name: Theron Arista   Number of children: 2   Years of education: hs   Highest education level: Not on file  Occupational History   Occupation: Retired  Tobacco Use   Smoking status: Never   Smokeless tobacco: Never  Vaping Use   Vaping status: Never Used  Substance and Sexual Activity   Alcohol use: Yes    Alcohol/week: 0.0 standard drinks of alcohol    Comment: occ (glass of wine)   Drug use: No   Sexual activity: Yes    Birth control/protection: None  Other Topics Concern   Not on file  Social History Narrative   Right handed.   Caffeine 1/2 cup in the morning.  Married, 2 kids.  College.  Retired.     Social Determinants of Health   Financial Resource Strain: Low Risk  (06/29/2022)   Received from [facility], [facility]   Overall Financial Resource Strain (CARDIA)    Difficulty of Paying Living Expenses: Not very hard  Food Insecurity: No Food Insecurity (06/29/2022)   Received from [facility], [facility]   Hunger Vital Sign    Worried About Running Out of Food in the Last Year: Never true    Ran Out of Food in the Last Year: Never true  Transportation Needs: No Transportation Needs (06/29/2022)   Received from [facility], [facility]   PRAPARE - Transportation    Lack of Transportation (Medical): No    Lack of Transportation (Non-Medical): No  Physical Activity: Unknown (06/29/2022)   Received from [facility], [facility]   Exercise Vital Sign    Days of Exercise per Week: 0 days    Minutes of Exercise per Session: Not on file  Stress: Stress Concern Present (  06/29/2022)   Received from [facility], [facility]   [facility] - Occupational Stress Questionnaire    Feeling of Stress : To some extent  Social Connections: Somewhat Isolated (06/29/2022)   Received from [facility], [facility]   Social Network    How would you rate your social network (family, work, friends)?: Restricted  participation with some degree of social isolation  Intimate Partner Violence: Not At Risk (06/29/2022)   Received from [facility], [facility]   HITS    Over the last 12 months how often did your partner physically hurt you?: 1    Over the last 12 months how often did your partner insult you or talk down to you?: 1    Over the last 12 months how often did your partner threaten you with physical harm?: 1    Over the last 12 months how often did your partner scream or curse at you?: 1      PHYSICAL EXAM  Vitals:   10/20/22 1039  BP: 99/63  Pulse: 76  Weight: 161 lb (73 kg)  Height: 5\' 5"  (1.651 m)     There is no height or weight on file to calculate BMI.    02/21/2021   10:43 AM 09/18/2020    2:13 PM 02/12/2020    1:31 PM  MMSE - Mini Mental State Exam  Orientation to time 1 0 3  Orientation to Place 1 5 5   Registration 3 3 3   Attention/ Calculation 0 0 1  Recall 0 0 0  Language- name 2 objects 2 2 2   Language- repeat 0 1 1  Language- follow 3 step command 2 3 3   Language- read & follow direction 0 1 0  Write a sentence 1 0 1  Copy design 0 0 0  Total score 10 15 19     Generalized: Well developed, in no acute distress  Neurological examination  Mentation: Alert oriented to time, place, history taking. Follows all commands speech and language fluent Cranial nerve II-XII: FPupils were equal round reactive to light extraocular movements were full, visual field were full on confrontational test. Facial sensation and strength were normal. hearing was intact to finger rubbing bilaterally. Uvula tongue midline. Head turning and shoulder shrug  were normal and symmetric. Motor: The motor testing reveals 5 over 5 strength of all 4 extremities. Good symmetric motor tone is noted throughout.  Sensory: Sensory testing is intact to  soft touch, on all 4 extremities. No evidence of extinction is noted.  Coordination: Cerebellar testing reveals good finger-nose-finger and  heel-to-shin bilaterally.  Gait and station: Gait is normal.      DIAGNOSTIC DATA (LABS, IMAGING, TESTING) - I reviewed patient records, labs, notes, testing and imaging myself where available.  Lab Results  Component Value Date   WBC 4.3 02/05/2022   HGB 11.3 (L) 02/05/2022   HCT 36.2 02/05/2022   MCV 71.6 (L) 02/05/2022   PLT 340.0 02/05/2022      Component Value Date/Time   NA 137 01/05/2014 1303   K 5.1 01/05/2014 1303   CL 100 01/05/2014 1303   CO2 28 01/05/2014 1303   GLUCOSE 95 01/05/2014 1303   GLUCOSE 80 09/17/2006 1233   BUN 14 01/05/2014 1303   CREATININE 0.79 01/05/2014 1303   CALCIUM 10.3 01/05/2014 1303   PROT 7.3 01/05/2014 1303   ALBUMIN 4.7 01/05/2014 1303   AST 22 01/05/2014 1303   ALT 24 01/05/2014 1303   ALKPHOS 74  01/05/2014 1303   BILITOT 0.2 01/05/2014 1303   GFRNONAA 78 01/05/2014 1303   GFRAA 90 01/05/2014 1303      ASSESSMENT AND PLAN 77 y.o. year old female  has a past medical history of Alzheimer's dementia (HCC), Anxiety, Colon polyps, Dementia (HCC), Depression, GERD (gastroesophageal reflux disease), Hiatal hernia, Internal hemorrhoids, and Tinnitus. here with:  Alzheimer's Disease with behavior disturbance  - Continue Risperdal 1 mg BID - Continue zoloft to 150 mg at bedtime  - Continue Ativan 0.5 mg daily PRN - FU 6 months or sooner if needed      Butch Penny, MSN, NP-C 10/20/2022, 10:32 AM [facility] [address], [address] [place], [place] [address] [phone]

## 2022-10-20 NOTE — Patient Instructions (Signed)
Your Plan:  Continue Risperdal 1 mg twice a day Continue Zoloft 150 mg at bedtime  Continue Ativan 1 tablet daily as needed If your symptoms worsen or you develop new symptoms please let us know.   Thank you for coming to see [place] at [facility]. I hope we have been able to provide you high quality care today.  You may receive a patient satisfaction survey over the next few weeks. We would appreciate your feedback and comments so that we may continue to improve ourselves and the health of our patients.

## 2022-10-28 ENCOUNTER — Telehealth: Payer: Self-pay | Admitting: Gastroenterology

## 2022-10-28 MED ORDER — PANTOPRAZOLE SODIUM 40 MG PO TBEC: 40.0000 mg | DELAYED_RELEASE_TABLET | Freq: Every day | ORAL | 3 refills | Status: AC

## 2022-10-28 NOTE — Telephone Encounter (Signed)
Inbound call from patients husband stating patient needs a refill for Protonix. Please advise.

## 2022-10-28 NOTE — Telephone Encounter (Signed)
Called and spoke to the patient and she was taking pantoprazole 20 mg two tablets once  daily. I told her that I could send in a 40 mg tablet so she would only have to take one pill at a time instead of two. Patient was agreeable to that.  [facility] [address]  Sent electronically today

## 2022-10-30 DIAGNOSIS — F0280 Dementia in other diseases classified elsewhere without behavioral disturbance: Secondary | ICD-10-CM | POA: Diagnosis not present

## 2022-10-30 DIAGNOSIS — G309 Alzheimer's disease, unspecified: Secondary | ICD-10-CM | POA: Diagnosis not present

## 2022-12-20 ENCOUNTER — Other Ambulatory Visit: Payer: Self-pay | Admitting: Gastroenterology

## 2022-12-20 ENCOUNTER — Other Ambulatory Visit: Payer: Self-pay | Admitting: Adult Health

## 2022-12-20 DIAGNOSIS — F02818 Dementia in other diseases classified elsewhere, unspecified severity, with other behavioral disturbance: Secondary | ICD-10-CM

## 2022-12-24 NOTE — Telephone Encounter (Signed)
Requested Prescriptions   Pending Prescriptions Disp Refills   LORazepam (ATIVAN) 0.5 MG tablet [Pharmacy Med Name: LORAZEPAM 0.5 MG TABLET] 30 tablet     Sig: TAKE 1 TABLET BY MOUTH ONCE DAILY AS NEEDED FOR ANXIETY   Last seen 10/20/22 Next appt 05/27/23 Routing to provider to refill  Dispenses:  [facility] note on 10/20/22 stated: Continue Ativan 1 tablet daily as needed  Dispensed Days Supply Quantity Provider Pharmacy  LORAZEPAM 0.5MG  TAB 07/30/2022 30 30 tablet Butch Penny, NP [phone] ...  LORAZEPAM 0.5MG  TAB 06/07/2022 30 30 tablet Butch Penny, NP [phone] ...  LORAZEPAM 0.5MG  TAB 05/11/2022 30 30 tablet Butch [facility], NP [facility] [phone] [facility] ...  LORAZEPAM 0.5MG  TAB 03/31/2022 30 30 tablet Butch Penny, NP [facility] [phone] [facility] ...  LORAZEPAM 0.5MG  TAB 02/17/2022 30 30 tablet Butch Penny, NP [phone] .Marland KitchenMarland Kitchen

## 2022-12-24 NOTE — Telephone Encounter (Signed)
Last seen 10-20-2022, last fill 07-30-2022 #30, next appt 05-27-2023

## 2022-12-24 NOTE — Telephone Encounter (Signed)
According to drug website it states that she has only used 2/5 refills. Can we check on this.

## 2023-01-21 ENCOUNTER — Other Ambulatory Visit: Payer: Self-pay | Admitting: Family Medicine

## 2023-01-21 DIAGNOSIS — Z1231 Encounter for screening mammogram for malignant neoplasm of breast: Secondary | ICD-10-CM

## 2023-02-01 ENCOUNTER — Telehealth: Payer: Self-pay | Admitting: Adult Health

## 2023-02-01 NOTE — Telephone Encounter (Signed)
LVM and sent mychart msg informing pt of need to reschedule 05/27/23 appt - NP out

## 2023-02-02 ENCOUNTER — Ambulatory Visit
Admission: RE | Admit: 2023-02-02 | Discharge: 2023-02-02 | Disposition: A | Discharge status: Home / Self Care | Payer: Medicare HMO | Source: Ambulatory Visit | Attending: Family Medicine | Admitting: Family Medicine

## 2023-02-02 DIAGNOSIS — Z1231 Encounter for screening mammogram for malignant neoplasm of breast: Secondary | ICD-10-CM

## 2023-02-06 DIAGNOSIS — Z20822 Contact with and (suspected) exposure to covid-19: Secondary | ICD-10-CM | POA: Diagnosis not present

## 2023-02-06 DIAGNOSIS — J069 Acute upper respiratory infection, unspecified: Secondary | ICD-10-CM | POA: Diagnosis not present

## 2023-02-06 DIAGNOSIS — R059 Cough, unspecified: Secondary | ICD-10-CM | POA: Diagnosis not present

## 2023-02-09 DIAGNOSIS — J069 Acute upper respiratory infection, unspecified: Secondary | ICD-10-CM | POA: Diagnosis not present

## 2023-02-09 DIAGNOSIS — R32 Unspecified urinary incontinence: Secondary | ICD-10-CM | POA: Diagnosis not present

## 2023-02-09 DIAGNOSIS — R8290 Unspecified abnormal findings in urine: Secondary | ICD-10-CM | POA: Diagnosis not present

## 2023-02-11 DIAGNOSIS — R8290 Unspecified abnormal findings in urine: Secondary | ICD-10-CM | POA: Diagnosis not present

## 2023-02-11 DIAGNOSIS — R32 Unspecified urinary incontinence: Secondary | ICD-10-CM | POA: Diagnosis not present

## 2023-03-08 DIAGNOSIS — F0280 Dementia in other diseases classified elsewhere without behavioral disturbance: Secondary | ICD-10-CM | POA: Diagnosis not present

## 2023-03-08 DIAGNOSIS — G309 Alzheimer's disease, unspecified: Secondary | ICD-10-CM | POA: Diagnosis not present

## 2023-03-08 DIAGNOSIS — L304 Erythema intertrigo: Secondary | ICD-10-CM | POA: Diagnosis not present

## 2023-03-08 DIAGNOSIS — R7303 Prediabetes: Secondary | ICD-10-CM | POA: Diagnosis not present

## 2023-03-08 DIAGNOSIS — D649 Anemia, unspecified: Secondary | ICD-10-CM | POA: Diagnosis not present

## 2023-03-25 DIAGNOSIS — H524 Presbyopia: Secondary | ICD-10-CM | POA: Diagnosis not present

## 2023-03-25 DIAGNOSIS — H52203 Unspecified astigmatism, bilateral: Secondary | ICD-10-CM | POA: Diagnosis not present

## 2023-03-25 DIAGNOSIS — Z961 Presence of intraocular lens: Secondary | ICD-10-CM | POA: Diagnosis not present

## 2023-03-25 DIAGNOSIS — H43813 Vitreous degeneration, bilateral: Secondary | ICD-10-CM | POA: Diagnosis not present

## 2023-03-25 DIAGNOSIS — H04123 Dry eye syndrome of bilateral lacrimal glands: Secondary | ICD-10-CM | POA: Diagnosis not present

## 2023-04-02 DIAGNOSIS — R350 Frequency of micturition: Secondary | ICD-10-CM | POA: Diagnosis not present

## 2023-04-02 DIAGNOSIS — R4689 Other symptoms and signs involving appearance and behavior: Secondary | ICD-10-CM | POA: Diagnosis not present

## 2023-05-03 ENCOUNTER — Telehealth: Payer: Self-pay | Admitting: Adult Health

## 2023-05-03 NOTE — Telephone Encounter (Signed)
 Spoke to husband(checked DPR ) Gave f/u appointment 05/12/2023 at 1100am . Husband was very appreciative for sooner appointment

## 2023-05-03 NOTE — Telephone Encounter (Signed)
 Pt's husband, Mauri Sheeder, risperiDONE  (RISPERDAL ) 1 MG tablet is not working. Pt having melt downs, throwing things;sometimes it very serious. Has scheduled appt with Dr. Albertina Hugger on April 28 and put on the waitlist.

## 2023-05-03 NOTE — Telephone Encounter (Signed)
 Wonderful, thank you

## 2023-05-12 ENCOUNTER — Ambulatory Visit: Payer: Medicare HMO | Admitting: Neurology

## 2023-05-14 DIAGNOSIS — F0280 Dementia in other diseases classified elsewhere without behavioral disturbance: Secondary | ICD-10-CM | POA: Diagnosis not present

## 2023-05-14 DIAGNOSIS — Z0000 Encounter for general adult medical examination without abnormal findings: Secondary | ICD-10-CM | POA: Diagnosis not present

## 2023-05-14 DIAGNOSIS — R7303 Prediabetes: Secondary | ICD-10-CM | POA: Diagnosis not present

## 2023-05-14 DIAGNOSIS — G309 Alzheimer's disease, unspecified: Secondary | ICD-10-CM | POA: Diagnosis not present

## 2023-05-27 ENCOUNTER — Ambulatory Visit: Payer: Medicare HMO | Admitting: Adult Health

## 2023-06-22 ENCOUNTER — Telehealth: Payer: Self-pay | Admitting: *Deleted

## 2023-06-22 NOTE — Telephone Encounter (Signed)
 [facility] faxed to [place] order form for pt taking lorazepam 0.5mg  tablet by mouth as needed for anxiety once daily.  Pt last seen 10-20-2022 next appt 07-19-2023 with Dr. Vickey Huger.  ( Had 2 previous appts cancelled by [place]).

## 2023-06-23 NOTE — Telephone Encounter (Signed)
 Signed.  Faxed to [facility] [phone].  Received fax confirmation.

## 2023-06-23 NOTE — Telephone Encounter (Signed)
 Signed and placed in box.

## 2023-07-19 ENCOUNTER — Ambulatory Visit: Payer: Medicare HMO | Admitting: Neurology

## 2023-07-19 ENCOUNTER — Encounter: Payer: Self-pay | Admitting: Neurology

## 2023-07-19 VITALS — BP 140/82 | HR 78 | Ht 62.0 in | Wt 160.2 lb

## 2023-07-19 DIAGNOSIS — F02C11 Dementia in other diseases classified elsewhere, severe, with agitation: Secondary | ICD-10-CM | POA: Diagnosis not present

## 2023-07-19 DIAGNOSIS — F02818 Dementia in other diseases classified elsewhere, unspecified severity, with other behavioral disturbance: Secondary | ICD-10-CM | POA: Diagnosis not present

## 2023-07-19 DIAGNOSIS — F0280 Dementia in other diseases classified elsewhere without behavioral disturbance: Secondary | ICD-10-CM

## 2023-07-19 DIAGNOSIS — G309 Alzheimer's disease, unspecified: Secondary | ICD-10-CM | POA: Diagnosis not present

## 2023-07-19 DIAGNOSIS — G301 Alzheimer's disease with late onset: Secondary | ICD-10-CM | POA: Insufficient documentation

## 2023-07-19 MED ORDER — SERTRALINE HCL 100 MG PO TABS: 100.0000 mg | ORAL_TABLET | Freq: Every day | ORAL | 5 refills | Status: AC

## 2023-07-19 MED ORDER — LORAZEPAM 0.5 MG PO TABS: 0.5000 mg | ORAL_TABLET | Freq: Every day | ORAL | 5 refills | Status: DC | PRN

## 2023-07-19 MED ORDER — ARIPIPRAZOLE 5 MG PO TABS: 5.0000 mg | ORAL_TABLET | Freq: Every day | ORAL | 5 refills | Status: DC

## 2023-07-19 NOTE — Progress Notes (Signed)
 Provider:  Neomia Banner, MD  Primary Care Physician:  Claudell Cruz, MD [address] [place] [address]     Referring Provider: Claudell Cruz, Md [address] [place],  [place] [address]          Chief Complaint according to patient   Patient presents with:                HISTORY OF PRESENT ILLNESS:  [name] is a 78 y.o. female patient who is here for revisit 07/19/2023 for  Dementia-  Needs assistance with dressing, not with toileting or feeding.   Chief concern according to patient :  husband and caregiver report progression of behaviour symptoms, needs to be kept busy, is anxious and jittery, mood switches rapidly, tearful, and then laughing , then angry.   She used to walk more but is now more sedate. Well [facility] resident - goes to the day camp for memory.   Urinary incontinence was worse during UTI last months,  no aspiration but problems to swallow omeprazole  capsule.  She caught Covid -  in January 2025.   She remains on Risperdal  and the family is feeling its no longer effective. It used to work for hours at a dose. She feels that someone is in the house or stole something or misplaced her items.  She reports ladies are coming and get properties from the home.   HISTORY 01/08/16 Copied from Dr. Jetta Morrow notes: 01-08-2016 , I had the pleasure of seeing Mr. and Mrs. [name] here today, both had an excellent holiday in [place] on cruise. We are meeting today to also reevaluate the cognitive status of Mrs. [name].  She still has some difficulties with attention, distractibility and appears anxious than ever in the test situation. 22-30, but loss of the recall words and the serial 7.  Patient has seen Dr Arfeen,psychiatrist.  Her mood changes and outbreaks have been frequent; impulse control is poor.  Starting Namenda .    07/28/17 Mrs. [name] is a 78 year old female with a history of memory disturbance. She  returns today for follow-up. The patient feels that her memory has remained stable. She is able to complete all ADLs independently. She lives at home with her husband. He knows that she does have trouble with short-term memory. He completes all the finances and has always done this. He reports that he does have to help her with organization. He does remind her of appointments. The patient manages her own medications however her husband oversee this. The patient states that she was on Aricept  however it caused her many side effects including not being able to sleep at night. She no longer takes this medication. She returns today for an evaluation.    07-28-2017, Mrs [name] is seen for memory decline.  She performed today on the Mini-Mental Status Examination 21 out of 30 points and her main problem remains short-term memory. She is less anxious. Spend the winter in [place] -and enjoyed a her social circle - on Zoloft .  She attributes her better mood to Yoga.4- 5 times a week. Plays Tennis. Not on Namenda - never started.   12-22-2018, Patient and husband here for regular memory check up, MMSE 22-30.  Returned from [place] -  And will over winter be there again.  The couple takes walks, and reduced their social outings. Tinnitus bilateral got worse, constant and  Non- pulsatile. ENT had nothing to offer, either. She  is fluently speaking.    02-12-2020: "pt with husband, rm 11. the husband has been calling multiple times because the patient has been getting angry and hostile. acting out more. he is concerned because she can become aggresive. 3 weeks ago her rings were misplaced and she is blaming the cleaning people for taking it. pt admits to feeling angry. she has no motivation to get up out of the bed. she thinks the sertaline helped with the depression initially but now she just is angry and no motivation to get up".      Mr. And Mrs. [name] here for RV, behavior has deteriorated.  Her mother has lived  with him- for a month of daily fights, misunderstanding, hearing is poor, age 34 years old.   She is originally from the [place], and her mother lives alone- neighbors taking care of her.  It has been a hard time for Mr. Corban-  Her anger, her temper, her accusations.  Dr. Leamon Pringle increased sertraline , and we added Seroquel  for the night.          07/19/2023    4:17 PM 02/21/2021   10:43 AM 09/18/2020    2:13 PM 02/12/2020    1:31 PM 09/20/2019    3:13 PM 12/22/2018    3:29 PM 07/28/2017    1:11 PM  MMSE - Mini Mental State Exam  Orientation to time 0 1 0 3 0 2 3  Orientation to Place 1 1 5 5 4 5 5   Registration 3 3 3 3 3 3 3   Attention/ Calculation 0 0 0 1 1 5 1   Recall 0 0 0 0 0 0 0  Language- name 2 objects 0 2 2 2 2 2 2   Language- repeat 0 0 1 1 1 1 1   Language- follow 3 step command 3 2 3 3 3 3 3   Language- read & follow direction 1 0 1 0 1 0 1  Write a sentence 0 1 0 1 1 1 1   Copy design 0 0 0 0 0 0 1  Total score 8 10 15 19 16 22 21      Review of Systems: Out of a complete 14 system review, the patient complains of only the following symptoms, and all other reviewed systems are negative.:   Social History   Socioeconomic History   Marital status: Married    Spouse name: Donata Fryer   Number of children: 2   Years of education: hs   Highest education level: Not on file  Occupational History   Occupation: Retired  Tobacco Use   Smoking status: Never   Smokeless tobacco: Never  Vaping Use   Vaping status: Never Used  Substance and Sexual Activity   Alcohol use: Yes    Comment: occ (glass of wine) "very seldom" per husband   Drug use: No   Sexual activity: Yes    Birth control/protection: None  Other Topics Concern   Not on file  Social History Narrative   Right handed.   Caffeine 1/2 cup in the morning.  Married, 2 kids.  College.  Retired.     Social Drivers of Corporate investment banker Strain: Low Risk  (05/14/2023)   Received from [facility]    Overall Financial Resource Strain (CARDIA)    Difficulty of Paying Living Expenses: Not very hard  Food Insecurity: No Food Insecurity (05/14/2023)   Received from [facility]   Hunger Vital Sign    Worried About Running Out of  Food in the Last Year: Never true    Ran Out of Food in the Last Year: Never true  Transportation Needs: No Transportation Needs (05/14/2023)   Received from [facility]   PRAPARE - Transportation    Lack of Transportation (Medical): No    Lack of Transportation (Non-Medical): No  Physical Activity: Unknown (05/14/2023)   Received from [facility]   Exercise Vital Sign    Days of Exercise per Week: 0 days    Minutes of Exercise per Session: Not on file  Stress: Stress Concern Present (05/14/2023)   Received from [facility] - Occupational Stress Questionnaire    Feeling of Stress : To some extent  Social Connections: Moderately Integrated (05/14/2023)   Received from [facility]   Social Network    How would you rate your social network (family, work, friends)?: Adequate participation with social networks    Family History  Problem Relation Age of Onset   Colon cancer Neg Hx    Breast cancer Neg Hx    Rectal cancer Neg Hx    Stomach cancer Neg Hx    Esophageal cancer Neg Hx    Alzheimer's disease Neg Hx    Dementia Neg Hx    Liver cancer Neg Hx     Past Medical History:  Diagnosis Date   Alzheimer's dementia (HCC)    Anxiety    Colon polyps    Dementia (HCC)    Depression    GERD (gastroesophageal reflux disease)    Hiatal hernia    Internal hemorrhoids    Tinnitus     Past Surgical History:  Procedure Laterality Date   ABDOMINAL HYSTERECTOMY     APPENDECTOMY     CERVICAL SPINE SURGERY     knee surgery Right    MOUTH SURGERY     roof of mouth   TONSILLECTOMY     WISDOM TOOTH EXTRACTION       Current Outpatient Medications on File Prior to Visit  Medication Sig Dispense Refill    atorvastatin (LIPITOR) 10 MG tablet Take 10 mg by mouth daily.     Ferrous Sulfate (IRON PO) Take 2 tablets by mouth daily.     LORazepam  (ATIVAN ) 0.5 MG tablet TAKE 1 TABLET BY MOUTH ONCE DAILY AS NEEDED FOR ANXIETY 30 tablet 5   Multiple Vitamin (MULTIVITAMIN) tablet Take 1 tablet by mouth daily.     pantoprazole  (PROTONIX ) 40 MG tablet Take 1 tablet (40 mg total) by mouth daily. 90 tablet 3   risperiDONE  (RISPERDAL ) 1 MG tablet Take 1 tablet (1 mg total) by mouth 2 (two) times daily. 60 tablet 11   sertraline  (ZOLOFT ) 100 MG tablet TAKE 1.5 TABLETS BY MOUTH AT BEDTIME. 45 tablet 5   omeprazole  (PRILOSEC) 20 MG capsule Take 1 capsule (20 mg total) by mouth in the morning and at bedtime. (Patient not taking: Reported on 07/19/2023) 60 capsule 2   No current facility-administered medications on file prior to visit.    Allergies  Allergen Reactions   Ibuprofen Nausea And Vomiting     DIAGNOSTIC DATA (LABS, IMAGING, TESTING) - I reviewed patient records, labs, notes, testing and imaging myself where available.  Lab Results  Component Value Date   WBC 4.3 02/05/2022   HGB 11.3 (L) 02/05/2022   HCT 36.2 02/05/2022   MCV 71.6 (L) 02/05/2022   PLT 340.0 02/05/2022      Component Value Date/Time   NA 137 01/05/2014 1303  K 5.1 01/05/2014 1303   CL 100 01/05/2014 1303   CO2 28 01/05/2014 1303   GLUCOSE 95 01/05/2014 1303   GLUCOSE 80 09/17/2006 1233   BUN 14 01/05/2014 1303   CREATININE 0.79 01/05/2014 1303   CALCIUM 10.3 01/05/2014 1303   PROT 7.3 01/05/2014 1303   ALBUMIN 4.7 01/05/2014 1303   AST 22 01/05/2014 1303   ALT 24 01/05/2014 1303   ALKPHOS 74 01/05/2014 1303   BILITOT 0.2 01/05/2014 1303   GFRNONAA 78 01/05/2014 1303   GFRAA 90 01/05/2014 1303   No results found for: "CHOL", "HDL", "LDLCALC", "LDLDIRECT", "TRIG", "CHOLHDL" No results found for: "HGBA1C" No results found for: "VITAMINB12" No results found for: "TSH"  PHYSICAL EXAM:  Today's Vitals    07/19/23 1613  BP: (!) 140/82  Pulse: 78  Weight: 160 lb 3.2 oz (72.7 kg)  Height: 5\' 2"  (1.575 m)   Body mass index is 29.3 kg/m.   Wt Readings from Last 3 Encounters:  07/19/23 160 lb 3.2 oz (72.7 kg)  10/20/22 161 lb (73 kg)  04/09/22 157 lb (71.2 kg)     Ht Readings from Last 3 Encounters:  07/19/23 5\' 2"  (1.575 m)  10/20/22 5\' 5"  (1.651 m)  04/09/22 5\' 4"  (1.626 m)      General: The patient is awake, alert and appears not in acute distress. Well developed, in no acute distress , well groomed.    Neurological examination  Mentation: Alert oriented to time, place, history taking. Follows all commands speech and language fluent.   Cranial nerve :  Taste and smell reportedly intact. Pupils were equal in size, round, and remain promptly reactive to light.  Extraocular movements were full, no nystagmus, beginning cataract on the left eye?   her visual fields were full on finger perimetry /confrontational test.  Facial sensation and strength were normal.  Uvula and tongue are midline. Head turning and shoulder shrug were symmetric. Motor:  . symmetric motor tone is noted throughout. No rigor, no cog- wheeling. No tremor.  Sensory: intact to vibration and soft touch on all 4 extremities.  No evidence of extinction is noted.  Coordination: intactfinger-nose- bilaterally.  Reflexes:  Symmetric bilaterally, brisk .   ASSESSMENT AND PLAN:   78 y.o. year old female  here with:    1) Advanced stage of Alzheimer's disease.  Screaming, having paraonoid ideas, being tearful, then laughing.  2) Remains on sertraline .  Exchange Risperdol for another medication.  Abilify 5 mg daily , d/c Risperdol.   3) Rv every 6 months unless PCP can take care over - this is gerontology after all.  Dr Leamon Pringle.    I plan to follow up through our NP within 6-8  months.  No more MMSE testing, just ADL documentation please.   I would like to thank Claudell Cruz, MD and Claudell Cruz, Md [address] [place],  [place] [address] for allowing me to meet with and to take care of this pleasant patient.     After spending a total time of  35  minutes face to face and additional time for physical and neurologic examination, review of laboratory studies,  personal review of imaging studies, reports and results of other testing and review of referral information / records as far as provided in visit,   Electronically signed by: Neomia Banner, MD 07/19/2023 4:29 PM  [facility] Sleep Board certified by The [employer] of Sleep Medicine and Diplomate  of the American Academy of Sleep Medicine. Board certified In Neurology through the ABPN, Fellow of the [employer] of Neurology.

## 2023-10-18 ENCOUNTER — Telehealth: Payer: Self-pay | Admitting: Neurology

## 2023-10-18 DIAGNOSIS — F0280 Dementia in other diseases classified elsewhere without behavioral disturbance: Secondary | ICD-10-CM

## 2023-10-18 DIAGNOSIS — G301 Alzheimer's disease with late onset: Secondary | ICD-10-CM

## 2023-10-18 NOTE — Telephone Encounter (Signed)
 Pt spouse reports that ARIPiprazole  (ABILIFY ) 5 MG tablet is not helping pt, please call to discuss.

## 2023-10-20 NOTE — Telephone Encounter (Signed)
 I think this is best addressed with a geriatric Psychiatrist

## 2023-10-20 NOTE — Telephone Encounter (Signed)
 Spoke w/Pt spouse who stated the Sidney is not working and agitation still present. Spouse is asking for recommendations or if med can be increased. Informed spouse will send message to provider and will reach out once she responds. Spouse voiced understanding and thanks for the call back.

## 2023-10-21 ENCOUNTER — Telehealth: Payer: Self-pay | Admitting: Neurology

## 2023-10-21 NOTE — Telephone Encounter (Signed)
 Spoke w/Pt husband to make him aware of Dr. Lionell recommendation for Pt to see geriatric psychiatrist. Husband asked which psychiatrist - noted Elna Lo is the usual referral. Husband voiced understanding, accepted the referral and thanks for the call back.

## 2023-10-21 NOTE — Telephone Encounter (Signed)
 Referral to Psychiatry faxed to [facility]   [facility]  Phone:[phone] Fax:[phone]

## 2023-10-21 NOTE — Addendum Note (Signed)
 Addended by: Magdala Brahmbhatt K on: 10/21/2023 01:24 PM   Modules accepted: Orders

## 2023-10-27 NOTE — Telephone Encounter (Signed)
 Received update from [facility] stating patient will call them back to schedule. They will update us  once an appointment is made

## 2023-11-04 NOTE — Telephone Encounter (Signed)
 Received a fax message on 10/28/23 from [facility]; Pt's husband called and stated they were not interested in scheduling at this time.

## 2023-11-09 ENCOUNTER — Other Ambulatory Visit: Payer: Self-pay | Admitting: Gastroenterology

## 2023-11-17 ENCOUNTER — Telehealth: Payer: Self-pay | Admitting: Neurology

## 2023-11-17 DIAGNOSIS — G301 Alzheimer's disease with late onset: Secondary | ICD-10-CM

## 2023-11-17 NOTE — Telephone Encounter (Signed)
 Pt is requesting a refill for LORazepam  (ATIVAN ) 0.5 MG tablet .  Pharmacy: [facility] [phone] [facility] [employer]

## 2023-11-17 NOTE — Telephone Encounter (Signed)
 Spoke w/Pt husband regarding refill request of lorazepam , informed him according to when medication was filled Pt has refills left but if he requested a refill too soon from pharmacy that may be why he could not get it refilled. Husband stated sometimes he gives Pt a second tablet during the day as the first one is not always effective. Informed spouse Pt is only to receive 1 tab per day according to medication directions and it is an as needed medication and not designed to be used daily. Spouse stated there are times when the medication is not effective with the agitation and anxiety. Voiced understanding to spouse but cautioned on the over use of the medication given Pt age. Spouse voiced understanding and asked if there is another medication Pt can take or if she can go back on Risperdal . Informed spouse will send message to provider for possible recommendations. Spouse voiced understanding and thanks for the call back.

## 2023-11-18 MED ORDER — LORAZEPAM 0.5 MG PO TABS: 0.5000 mg | ORAL_TABLET | Freq: Every day | ORAL | 5 refills | Status: AC | PRN

## 2023-12-08 NOTE — Assessment & Plan Note (Signed)
 Continued decline appreciated today.

## 2024-01-03 ENCOUNTER — Other Ambulatory Visit: Payer: Self-pay | Admitting: Family Medicine

## 2024-01-03 DIAGNOSIS — Z1231 Encounter for screening mammogram for malignant neoplasm of breast: Secondary | ICD-10-CM

## 2024-01-29 ENCOUNTER — Other Ambulatory Visit: Payer: Self-pay | Admitting: Adult Health

## 2024-02-04 ENCOUNTER — Ambulatory Visit
Admission: RE | Admit: 2024-02-04 | Discharge: 2024-02-04 | Disposition: A | Discharge status: Home / Self Care | Source: Ambulatory Visit | Attending: Family Medicine | Admitting: Family Medicine

## 2024-02-04 DIAGNOSIS — Z1231 Encounter for screening mammogram for malignant neoplasm of breast: Secondary | ICD-10-CM

## 2024-02-21 ENCOUNTER — Encounter: Payer: Self-pay | Admitting: Neurology

## 2024-02-21 ENCOUNTER — Telehealth: Payer: Self-pay | Admitting: Neurology

## 2024-02-21 ENCOUNTER — Ambulatory Visit: Admitting: Neurology

## 2024-02-21 NOTE — Telephone Encounter (Signed)
 Pt caretaker here to reschedule appt because pt husband got lost. She states that pt needs Respitol back, because the other pill Dohmeier prescherived months ago is giving her adverse reactions, and cannot keep giving her Ativan  all day either. Please call son Camellia, as caretaker not on DPR yet, but will be soon, once Dahiana signs it.

## 2024-02-22 MED ORDER — RISPERIDONE 0.5 MG PO TABS: 0.5000 mg | ORAL_TABLET | Freq: Every day | ORAL | 2 refills | Status: DC

## 2024-02-22 NOTE — Telephone Encounter (Addendum)
 Called son back. Caretaker does not like that only option is Ativan . Feels this is too strong for her. She stopped Abilify  d/t SE (Acted strange, abnormal eye movements). Wanting to know if she can go back to Risperdal  or if there is another medication she would recommend?  She goes to memory care during the day Mon-Fri. They have ativan  to provide her prn as well. They are giving more frequently.  He was not aware of her being referred to psychiatry in the past. He wants to see what Dr. Chalice recommends first.   She missed appt yesterday with Dr. Chalice d/t husband getting lost. She is currently scheduled 06/2024 and I added to wait list. Son wanting to know if Dr. Chalice would be willing to work her in somewhere in the next couple weeks?

## 2024-02-22 NOTE — Telephone Encounter (Addendum)
 Per last OV note 07/19/23: She remains on Risperdal  and the family is feeling its no longer effective. Remains on sertraline . Exchange Risperdol for another medication. Abilify  5 mg daily , d/c Risperdol.   It also appears Dr. Chalice wanted these concerns handled by geriatric psychiatry but did call in lorazepam  (not Xanax) 10/2023 (see below)    I called son/Eric on DPR at [phone]. LVM

## 2024-02-22 NOTE — Addendum Note (Signed)
 Addended by: JOSHUA MAURILIO CROME on: 02/22/2024 01:54 PM   Modules accepted: Orders

## 2024-02-22 NOTE — Telephone Encounter (Signed)
 Pt son called returnig call , Inform Nurse will call back

## 2024-02-22 NOTE — Addendum Note (Signed)
 Addended by: CHALICE SAUNAS on: 02/22/2024 04:45 PM   Modules accepted: Orders

## 2024-02-22 NOTE — Telephone Encounter (Signed)
 I am OK with Risperdal  at night, but its not great as a PRN. With nightly Risperdal  0.5 mg there may not be a need for as much ativan  in daytime.  Can we keep ativan  prn while stating back on Risperdal .

## 2024-02-23 NOTE — Telephone Encounter (Signed)
 Called son/Eric. Relayed update from Dr. Chalice on the risperdal /lorazepam . He verbalized understanding.   He is going to call back about getting sooner appt scheduled. He wants to speak with caretaker first.

## 2024-02-29 ENCOUNTER — Other Ambulatory Visit: Payer: Self-pay | Admitting: Neurology

## 2024-03-08 MED ORDER — RISPERIDONE 0.5 MG PO TABS: 0.5000 mg | ORAL_TABLET | Freq: Every day | ORAL | 2 refills | Status: AC

## 2024-03-08 NOTE — Telephone Encounter (Signed)
 Risperdal  was ordered at 0.5 mg and would be used every day at bedtime , hopefully reducing the need for prn use  of benzodiazepines in daytime ( anxiety/ agitation medication) . This medication had been used at a higher doe of 1 mg bid po in July last year.   02-22-2024 was date of prescription .   I see that RN has canceled the prescription and replaced with   Dedra Gores, MD   Cc PCP.

## 2024-07-04 ENCOUNTER — Ambulatory Visit: Admitting: Neurology
